# Patient Record
Sex: Female | Born: 1960 | Race: White | Hispanic: Refuse to answer | Smoking: Never smoker
Health system: Southern US, Community
[De-identification: ages and names within clinical notes are randomized; demographics above are authoritative.]

## PROBLEM LIST (undated history)

## (undated) DIAGNOSIS — I447 Left bundle-branch block, unspecified: Secondary | ICD-10-CM

## (undated) DIAGNOSIS — I454 Nonspecific intraventricular block: Secondary | ICD-10-CM

## (undated) DIAGNOSIS — F419 Anxiety disorder, unspecified: Secondary | ICD-10-CM

## (undated) DIAGNOSIS — E7800 Pure hypercholesterolemia, unspecified: Secondary | ICD-10-CM

## (undated) DIAGNOSIS — Z8249 Family history of ischemic heart disease and other diseases of the circulatory system: Secondary | ICD-10-CM

## (undated) HISTORY — DX: Family history of ischemic heart disease and other diseases of the circulatory system: Z82.49

## (undated) HISTORY — DX: Nonspecific intraventricular block: I45.4

## (undated) HISTORY — DX: Left bundle-branch block, unspecified: I44.7

## (undated) HISTORY — PX: WISDOM TOOTH EXTRACTION: SHX21

---

## 2000-09-29 ENCOUNTER — Other Ambulatory Visit: Admission: RE | Admit: 2000-09-29 | Discharge: 2000-09-29 | Payer: Self-pay | Admitting: *Deleted

## 2000-12-27 ENCOUNTER — Encounter: Payer: Self-pay | Admitting: *Deleted

## 2000-12-27 ENCOUNTER — Encounter: Admission: RE | Admit: 2000-12-27 | Discharge: 2000-12-27 | Payer: Self-pay | Admitting: *Deleted

## 2001-11-06 ENCOUNTER — Other Ambulatory Visit: Admission: RE | Admit: 2001-11-06 | Discharge: 2001-11-06 | Payer: Self-pay | Admitting: Obstetrics & Gynecology

## 2002-05-02 ENCOUNTER — Encounter: Admission: RE | Admit: 2002-05-02 | Discharge: 2002-05-02 | Payer: Self-pay | Admitting: Obstetrics & Gynecology

## 2002-05-02 ENCOUNTER — Encounter: Payer: Self-pay | Admitting: Obstetrics & Gynecology

## 2002-11-15 ENCOUNTER — Other Ambulatory Visit: Admission: RE | Admit: 2002-11-15 | Discharge: 2002-11-15 | Payer: Self-pay | Admitting: Obstetrics & Gynecology

## 2005-11-01 ENCOUNTER — Ambulatory Visit (HOSPITAL_COMMUNITY): Admission: RE | Admit: 2005-11-01 | Discharge: 2005-11-01 | Payer: Self-pay | Admitting: Obstetrics & Gynecology

## 2006-11-14 ENCOUNTER — Ambulatory Visit (HOSPITAL_COMMUNITY): Admission: RE | Admit: 2006-11-14 | Discharge: 2006-11-14 | Payer: Self-pay | Admitting: Obstetrics & Gynecology

## 2006-11-16 ENCOUNTER — Encounter: Admission: RE | Admit: 2006-11-16 | Discharge: 2006-11-16 | Payer: Self-pay | Admitting: Obstetrics & Gynecology

## 2007-12-06 ENCOUNTER — Ambulatory Visit (HOSPITAL_COMMUNITY): Admission: RE | Admit: 2007-12-06 | Discharge: 2007-12-06 | Payer: Self-pay | Admitting: Obstetrics & Gynecology

## 2008-12-31 ENCOUNTER — Ambulatory Visit: Payer: Self-pay | Admitting: Internal Medicine

## 2009-01-22 HISTORY — PX: NM MYOCAR PERF WALL MOTION: HXRAD629

## 2009-01-22 HISTORY — PX: US ECHOCARDIOGRAPHY: HXRAD669

## 2009-02-13 ENCOUNTER — Encounter: Payer: Self-pay | Admitting: Internal Medicine

## 2009-02-13 ENCOUNTER — Telehealth: Payer: Self-pay | Admitting: Internal Medicine

## 2009-03-03 ENCOUNTER — Ambulatory Visit (HOSPITAL_COMMUNITY): Admission: RE | Admit: 2009-03-03 | Discharge: 2009-03-03 | Payer: Self-pay | Admitting: Obstetrics & Gynecology

## 2009-03-16 ENCOUNTER — Encounter (INDEPENDENT_AMBULATORY_CARE_PROVIDER_SITE_OTHER): Payer: Self-pay | Admitting: *Deleted

## 2009-03-17 ENCOUNTER — Ambulatory Visit: Payer: Self-pay | Admitting: Internal Medicine

## 2009-03-25 ENCOUNTER — Telehealth: Payer: Self-pay | Admitting: Internal Medicine

## 2009-03-26 ENCOUNTER — Ambulatory Visit: Payer: Self-pay | Admitting: Internal Medicine

## 2009-10-27 ENCOUNTER — Encounter (INDEPENDENT_AMBULATORY_CARE_PROVIDER_SITE_OTHER): Payer: Self-pay | Admitting: *Deleted

## 2009-10-27 ENCOUNTER — Telehealth: Payer: Self-pay | Admitting: Internal Medicine

## 2010-02-25 ENCOUNTER — Ambulatory Visit: Payer: Self-pay | Admitting: Licensed Clinical Social Worker

## 2010-03-03 ENCOUNTER — Ambulatory Visit: Payer: Self-pay | Admitting: Licensed Clinical Social Worker

## 2010-03-04 ENCOUNTER — Ambulatory Visit (HOSPITAL_COMMUNITY): Admission: RE | Admit: 2010-03-04 | Discharge: 2010-03-04 | Payer: Self-pay | Admitting: Obstetrics & Gynecology

## 2010-03-12 ENCOUNTER — Ambulatory Visit: Payer: Self-pay | Admitting: Licensed Clinical Social Worker

## 2010-03-16 ENCOUNTER — Ambulatory Visit: Payer: Self-pay | Admitting: Licensed Clinical Social Worker

## 2010-03-26 ENCOUNTER — Ambulatory Visit: Payer: Self-pay | Admitting: Licensed Clinical Social Worker

## 2010-03-31 ENCOUNTER — Ambulatory Visit: Payer: Self-pay | Admitting: Licensed Clinical Social Worker

## 2010-04-09 ENCOUNTER — Ambulatory Visit: Payer: Self-pay | Admitting: Licensed Clinical Social Worker

## 2010-05-12 ENCOUNTER — Ambulatory Visit
Admission: RE | Admit: 2010-05-12 | Discharge: 2010-05-12 | Payer: Self-pay | Source: Home / Self Care | Attending: Licensed Clinical Social Worker | Admitting: Licensed Clinical Social Worker

## 2010-05-25 NOTE — Progress Notes (Signed)
Summary: triage  Phone Note Call from Patient Call back at Work Phone [phone]   Caller: Patient Reason for Call: Talk to Nurse Summary of Call: Pt is having reflux and abdominal pain. Pt states it is getting worse.  Appt scheduled for 9/15/11with Dr. Juanda Chance, but pt would like a sooner appt. Initial call taken by: Francee Piccolo CMA Duncan Dull),  October 27, 2009 12:27 PM  Follow-up for Phone Call        Patient  c/o refulx and upper abdominal pain.  Started this weekend.  She tried Prilosec OTC and Pepcid and this helped her symptoms.  Patient  has hx with Dr Juanda Chance for a colon.  She is offered to move her appointment to this Friday for 10/30/09, but the patient declines, she wants to keep the appt as is and try dietary changes first.  I reviewed with her anti-reflux diet and measuers including avoid caffeine, mint, citrus foods/juices, tomatoes, chocolate. Instructed not to eat within 2 hours of exercise or bed, small meals are better than 3 large.   She is advised to make sure she is taking Prilosec 30 minutes before the first meal of the day and can increase to two times a day if needed.  She is asked to call back if symptoms worsen prior to her appointment. Follow-up by: Darcey Nora RN, CGRN,  October 27, 2009 1:35 PM  Additional Follow-up for Phone Call Additional follow up Details #1::        reviewed and agree. Try Prilosec as instructed. Additional Follow-up by: Hart Carwin MD,  October 27, 2009 2:46 PM

## 2010-05-25 NOTE — Letter (Signed)
Summary: New Patient letter  [facility]  [address] [place], [place] [address]   Phone: [phone]  Fax: [phone]       10/27/2009 MRN: [id number]  Barbara Nixon [address] [place], [place]  [address]  Dear Barbara Nixon,  Welcome to the Gastroenterology Division at [facility].    You are scheduled to see Dr.  Juanda Chance on January 07, 2010 at 1:30 pm on the 3rd floor at [facility], [address]  We ask that you try to arrive at our office 15 minutes prior to your appointment time to allow for check-in.  We would like you to complete the enclosed self-administered evaluation form prior to your visit and bring it with you on the day of your appointment.  We will review it with you.  Also, please bring a complete list of all your medications or, if you prefer, bring the medication bottles and we will list them.  Please bring your insurance card so that we may make a copy of it.  If your insurance requires a referral to see a specialist, please bring your referral form from your primary care physician.  Co-payments are due at the time of your visit and may be paid by cash, check or credit card.     Your office visit will consist of a consult with your physician (includes a physical exam), any laboratory testing he/she may order, scheduling of any necessary diagnostic testing (e.g. x-ray, ultrasound, CT-scan), and scheduling of a procedure (e.g. Endoscopy, Colonoscopy) if required.  Please allow enough time on your schedule to allow for any/all of these possibilities.    If you cannot keep your appointment, please call [phone] to cancel or reschedule prior to your appointment date.  This allows [place] the opportunity to schedule an appointment for another patient in need of care.  If you do not cancel or reschedule by 5 p.m. the business day prior to your appointment date, you will be charged a $50.00 late cancellation/no-show fee.    Thank you for  choosing [facility] for your medical needs.  We appreciate the opportunity to care for you.  Please visit [place] at our website  to learn more about our practice.                     Sincerely,                                                             The Gastroenterology Division

## 2010-05-28 ENCOUNTER — Ambulatory Visit (INDEPENDENT_AMBULATORY_CARE_PROVIDER_SITE_OTHER): Payer: Managed Care, Other (non HMO) | Admitting: Licensed Clinical Social Worker

## 2010-05-28 DIAGNOSIS — F4323 Adjustment disorder with mixed anxiety and depressed mood: Secondary | ICD-10-CM

## 2010-07-22 ENCOUNTER — Ambulatory Visit (INDEPENDENT_AMBULATORY_CARE_PROVIDER_SITE_OTHER): Payer: Managed Care, Other (non HMO) | Admitting: Licensed Clinical Social Worker

## 2010-07-22 DIAGNOSIS — F411 Generalized anxiety disorder: Secondary | ICD-10-CM

## 2010-07-22 DIAGNOSIS — F331 Major depressive disorder, recurrent, moderate: Secondary | ICD-10-CM

## 2010-08-12 ENCOUNTER — Ambulatory Visit (INDEPENDENT_AMBULATORY_CARE_PROVIDER_SITE_OTHER): Payer: Managed Care, Other (non HMO) | Admitting: Licensed Clinical Social Worker

## 2010-08-12 DIAGNOSIS — F331 Major depressive disorder, recurrent, moderate: Secondary | ICD-10-CM

## 2010-08-12 DIAGNOSIS — F411 Generalized anxiety disorder: Secondary | ICD-10-CM

## 2010-08-19 ENCOUNTER — Ambulatory Visit (INDEPENDENT_AMBULATORY_CARE_PROVIDER_SITE_OTHER): Payer: Managed Care, Other (non HMO) | Admitting: Licensed Clinical Social Worker

## 2010-08-19 DIAGNOSIS — F411 Generalized anxiety disorder: Secondary | ICD-10-CM

## 2010-08-19 DIAGNOSIS — F331 Major depressive disorder, recurrent, moderate: Secondary | ICD-10-CM

## 2011-10-18 ENCOUNTER — Other Ambulatory Visit (HOSPITAL_COMMUNITY): Payer: Self-pay | Admitting: Obstetrics & Gynecology

## 2011-10-18 DIAGNOSIS — Z1231 Encounter for screening mammogram for malignant neoplasm of breast: Secondary | ICD-10-CM

## 2011-11-11 ENCOUNTER — Ambulatory Visit (HOSPITAL_COMMUNITY)
Admission: RE | Admit: 2011-11-11 | Discharge: 2011-11-11 | Disposition: A | Discharge status: Home / Self Care | Payer: Managed Care, Other (non HMO) | Source: Ambulatory Visit | Attending: Obstetrics & Gynecology | Admitting: Obstetrics & Gynecology

## 2011-11-11 DIAGNOSIS — Z1231 Encounter for screening mammogram for malignant neoplasm of breast: Secondary | ICD-10-CM

## 2012-05-16 ENCOUNTER — Other Ambulatory Visit: Payer: Self-pay | Admitting: Orthopedic Surgery

## 2012-05-16 ENCOUNTER — Encounter (HOSPITAL_BASED_OUTPATIENT_CLINIC_OR_DEPARTMENT_OTHER): Payer: Self-pay | Admitting: *Deleted

## 2012-05-16 NOTE — Progress Notes (Signed)
To [facility] at 0715- Hg on arrival,Npo after Mn-orders pending from Dr Shelle Iron

## 2012-05-17 ENCOUNTER — Other Ambulatory Visit: Payer: Self-pay | Admitting: Orthopedic Surgery

## 2012-05-18 ENCOUNTER — Encounter (HOSPITAL_BASED_OUTPATIENT_CLINIC_OR_DEPARTMENT_OTHER): Payer: Self-pay | Admitting: Anesthesiology

## 2012-05-18 ENCOUNTER — Ambulatory Visit (HOSPITAL_BASED_OUTPATIENT_CLINIC_OR_DEPARTMENT_OTHER)
Admission: RE | Admit: 2012-05-18 | Discharge: 2012-05-18 | Disposition: A | Discharge status: Home / Self Care | Payer: Managed Care, Other (non HMO) | Source: Ambulatory Visit | Attending: Specialist | Admitting: Specialist

## 2012-05-18 ENCOUNTER — Encounter (HOSPITAL_BASED_OUTPATIENT_CLINIC_OR_DEPARTMENT_OTHER): Payer: Self-pay

## 2012-05-18 ENCOUNTER — Ambulatory Visit (HOSPITAL_BASED_OUTPATIENT_CLINIC_OR_DEPARTMENT_OTHER): Payer: Managed Care, Other (non HMO) | Admitting: Anesthesiology

## 2012-05-18 ENCOUNTER — Encounter (HOSPITAL_BASED_OUTPATIENT_CLINIC_OR_DEPARTMENT_OTHER)
Admission: RE | Disposition: A | Discharge status: Home / Self Care | Payer: Self-pay | Source: Ambulatory Visit | Attending: Specialist

## 2012-05-18 DIAGNOSIS — X58XXXA Exposure to other specified factors, initial encounter: Secondary | ICD-10-CM | POA: Insufficient documentation

## 2012-05-18 DIAGNOSIS — Z79899 Other long term (current) drug therapy: Secondary | ICD-10-CM | POA: Insufficient documentation

## 2012-05-18 DIAGNOSIS — E7800 Pure hypercholesterolemia, unspecified: Secondary | ICD-10-CM | POA: Insufficient documentation

## 2012-05-18 DIAGNOSIS — IMO0002 Reserved for concepts with insufficient information to code with codable children: Secondary | ICD-10-CM | POA: Insufficient documentation

## 2012-05-18 DIAGNOSIS — M2240 Chondromalacia patellae, unspecified knee: Secondary | ICD-10-CM | POA: Insufficient documentation

## 2012-05-18 DIAGNOSIS — S83242A Other tear of medial meniscus, current injury, left knee, initial encounter: Secondary | ICD-10-CM

## 2012-05-18 HISTORY — DX: Anxiety disorder, unspecified: F41.9

## 2012-05-18 HISTORY — PX: KNEE ARTHROSCOPY WITH MEDIAL MENISECTOMY: SHX5651

## 2012-05-18 HISTORY — DX: Pure hypercholesterolemia, unspecified: E78.00

## 2012-05-18 LAB — POCT I-STAT 4, (NA,K, GLUC, HGB,HCT)
HCT: 44 % (ref 36.0–46.0)
Sodium: 140 mEq/L (ref 135–145)

## 2012-05-18 SURGERY — ARTHROSCOPY, KNEE, WITH MEDIAL MENISCECTOMY
Anesthesia: General | Site: Knee | Laterality: Left

## 2012-05-18 MED ORDER — ONDANSETRON HCL 4 MG/2ML IJ SOLN
INTRAMUSCULAR | Status: DC | PRN
  Administered 2012-05-18: 4 mg via INTRAVENOUS

## 2012-05-18 MED ORDER — SODIUM CHLORIDE 0.9 % IR SOLN
Status: DC | PRN
  Administered 2012-05-18: 09:00:00

## 2012-05-18 MED ORDER — FENTANYL CITRATE 0.05 MG/ML IJ SOLN
25.0000 ug | INTRAMUSCULAR | Status: DC | PRN
  Filled 2012-05-18: qty 1

## 2012-05-18 MED ORDER — DEXAMETHASONE SODIUM PHOSPHATE 4 MG/ML IJ SOLN
INTRAMUSCULAR | Status: DC | PRN
  Administered 2012-05-18: 10 mg via INTRAVENOUS

## 2012-05-18 MED ORDER — BUPIVACAINE-EPINEPHRINE 0.5% -1:200000 IJ SOLN
INTRAMUSCULAR | Status: DC | PRN
  Administered 2012-05-18: 25 mL

## 2012-05-18 MED ORDER — LACTATED RINGERS IV SOLN
INTRAVENOUS | Status: DC
  Administered 2012-05-18: 08:00:00 via INTRAVENOUS
  Filled 2012-05-18: qty 1000

## 2012-05-18 MED ORDER — KETOROLAC TROMETHAMINE 30 MG/ML IJ SOLN
INTRAMUSCULAR | Status: DC | PRN
  Administered 2012-05-18: 30 mg via INTRAVENOUS

## 2012-05-18 MED ORDER — LIDOCAINE HCL (CARDIAC) 20 MG/ML IV SOLN
INTRAVENOUS | Status: DC | PRN
  Administered 2012-05-18: 60 mg via INTRAVENOUS

## 2012-05-18 MED ORDER — PROPOFOL 10 MG/ML IV BOLUS
INTRAVENOUS | Status: DC | PRN
  Administered 2012-05-18: 200 mg via INTRAVENOUS

## 2012-05-18 MED ORDER — MIDAZOLAM HCL 5 MG/5ML IJ SOLN
INTRAMUSCULAR | Status: DC | PRN
  Administered 2012-05-18: 2 mg via INTRAVENOUS

## 2012-05-18 MED ORDER — KETOROLAC TROMETHAMINE 30 MG/ML IJ SOLN
15.0000 mg | Freq: Once | INTRAMUSCULAR | Status: DC | PRN
  Filled 2012-05-18: qty 1

## 2012-05-18 MED ORDER — PROMETHAZINE HCL 25 MG/ML IJ SOLN
6.2500 mg | INTRAMUSCULAR | Status: DC | PRN
  Filled 2012-05-18: qty 1

## 2012-05-18 MED ORDER — LACTATED RINGERS IV SOLN
INTRAVENOUS | Status: DC
  Filled 2012-05-18: qty 1000

## 2012-05-18 MED ORDER — CHLORHEXIDINE GLUCONATE 4 % EX LIQD
60.0000 mL | Freq: Once | CUTANEOUS | Status: DC
  Filled 2012-05-18: qty 60

## 2012-05-18 MED ORDER — ACETAMINOPHEN 10 MG/ML IV SOLN
INTRAVENOUS | Status: DC | PRN
  Administered 2012-05-18: 1000 mg via INTRAVENOUS

## 2012-05-18 MED ORDER — HYDROCODONE-ACETAMINOPHEN 5-325 MG PO TABS: 1.0000 | ORAL_TABLET | ORAL | Status: DC | PRN

## 2012-05-18 MED ORDER — FENTANYL CITRATE 0.05 MG/ML IJ SOLN
INTRAMUSCULAR | Status: DC | PRN
  Administered 2012-05-18: 100 ug via INTRAVENOUS

## 2012-05-18 MED ORDER — CEFAZOLIN SODIUM-DEXTROSE 2-3 GM-% IV SOLR
2.0000 g | INTRAVENOUS | Status: AC
  Administered 2012-05-18: 2 g via INTRAVENOUS
  Filled 2012-05-18: qty 50

## 2012-05-18 MED ORDER — SODIUM CHLORIDE 0.9 % IR SOLN
Status: DC | PRN
  Administered 2012-05-18: 3000 mL

## 2012-05-18 SURGICAL SUPPLY — 46 items
BANDAGE ELASTIC 6 VELCRO ST LF (GAUZE/BANDAGES/DRESSINGS) ×2 IMPLANT
BLADE 4.2CUDA (BLADE) IMPLANT
BLADE CUDA SHAVER 3.5 (BLADE) ×2 IMPLANT
BLADE GREAT WHITE 4.2 (BLADE) IMPLANT
BOOTIES KNEE HIGH SLOAN (MISCELLANEOUS) ×2 IMPLANT
CANISTER SUCT LVC 12 LTR MEDI- (MISCELLANEOUS) ×2 IMPLANT
CANISTER SUCTION 1200CC (MISCELLANEOUS) IMPLANT
CANISTER SUCTION 2500CC (MISCELLANEOUS) IMPLANT
CANNULA ACUFLEX KIT 5X76 (CANNULA) IMPLANT
CLOTH BEACON ORANGE TIMEOUT ST (SAFETY) ×2 IMPLANT
CUTTER MENISCUS  4.2MM (BLADE)
CUTTER MENISCUS 4.2MM (BLADE) IMPLANT
DRAPE ARTHROSCOPY W/POUCH 114 (DRAPES) ×2 IMPLANT
DRSG EMULSION OIL 3X3 NADH (GAUZE/BANDAGES/DRESSINGS) ×2 IMPLANT
DRSG PAD ABDOMINAL 8X10 ST (GAUZE/BANDAGES/DRESSINGS) ×2 IMPLANT
DURAPREP 26ML APPLICATOR (WOUND CARE) ×2 IMPLANT
ELECT REM PT RETURN 9FT ADLT (ELECTROSURGICAL)
ELECTRODE REM PT RTRN 9FT ADLT (ELECTROSURGICAL) IMPLANT
GAUZE SPONGE 4X4 12PLY STRL LF (GAUZE/BANDAGES/DRESSINGS) ×2 IMPLANT
GLOVE BIO SURGEON STRL SZ 6.5 (GLOVE) ×2 IMPLANT
GLOVE BIO SURGEON STRL SZ7 (GLOVE) ×2 IMPLANT
GLOVE BIOGEL PI IND STRL 7.0 (GLOVE) ×2 IMPLANT
GLOVE BIOGEL PI INDICATOR 7.0 (GLOVE) ×2
GLOVE SURG SS PI 8.0 STRL IVOR (GLOVE) ×2 IMPLANT
GOWN PREVENTION PLUS LG XLONG (DISPOSABLE) ×2 IMPLANT
GOWN STRL REIN XL XLG (GOWN DISPOSABLE) ×6 IMPLANT
IV NS IRRIG 3000ML ARTHROMATIC (IV SOLUTION) ×6 IMPLANT
KNEE WRAP E Z 3 GEL PACK (MISCELLANEOUS) ×2 IMPLANT
MINI VAC (SURGICAL WAND) IMPLANT
NDL SAFETY ECLIPSE 18X1.5 (NEEDLE) ×2 IMPLANT
NEEDLE FILTER BLUNT 18X 1/2SAF (NEEDLE) ×1
NEEDLE FILTER BLUNT 18X1 1/2 (NEEDLE) ×1 IMPLANT
NEEDLE HYPO 18GX1.5 SHARP (NEEDLE) ×2
PACK ARTHROSCOPY DSU (CUSTOM PROCEDURE TRAY) ×2 IMPLANT
PACK BASIN DAY SURGERY FS (CUSTOM PROCEDURE TRAY) ×2 IMPLANT
PADDING CAST COTTON 6X4 STRL (CAST SUPPLIES) ×2 IMPLANT
RESECTOR FULL RADIUS 4.2MM (BLADE) IMPLANT
SET ARTHROSCOPY TUBING (MISCELLANEOUS) ×1
SET ARTHROSCOPY TUBING LN (MISCELLANEOUS) ×1 IMPLANT
SPONGE GAUZE 4X4 12PLY (GAUZE/BANDAGES/DRESSINGS) ×2 IMPLANT
SUT ETHILON 4 0 PS 2 18 (SUTURE) ×2 IMPLANT
SYR 30ML LL (SYRINGE) ×2 IMPLANT
SYRINGE 10CC LL (SYRINGE) ×2 IMPLANT
TOWEL OR 17X24 6PK STRL BLUE (TOWEL DISPOSABLE) ×2 IMPLANT
WAND 90 DEG TURBOVAC W/CORD (SURGICAL WAND) IMPLANT
WATER STERILE IRR 500ML POUR (IV SOLUTION) ×2 IMPLANT

## 2012-05-18 NOTE — Brief Op Note (Signed)
05/18/2012  9:54 AM  PATIENT:  Barbara Nixon  52 y.o. female  PRE-OPERATIVE DIAGNOSIS:  Left Knee medial mensical tear  POST-OPERATIVE DIAGNOSIS:  Left Knee medial mensical tear  PROCEDURE:  Procedure(s) (LRB) with comments: KNEE ARTHROSCOPY WITH MEDIAL MENISECTOMY (Left) - Left Knee Arthroscopy with Partial Medial Menisectomy AND DEBRIDEMENT  SURGEON:  Surgeon(s) and Role:    * [name], MD - Primary  PHYSICIAN ASSISTANT:   ASSISTANTS: none   ANESTHESIA:   general  EBL:  Total I/O In: 200 [I.V.:200] Out: -   BLOOD ADMINISTERED:none  DRAINS: none   LOCAL MEDICATIONS USED:  MARCAINE     SPECIMEN:  No Specimen  DISPOSITION OF SPECIMEN:  N/A  COUNTS:  YES  TOURNIQUET:  * No tourniquets in log *  DICTATION: .Other Dictation: Dictation Number [id number]  PLAN OF CARE: Discharge to home after PACU  PATIENT DISPOSITION:  PACU - hemodynamically stable.   Delay start of Pharmacological VTE agent (>24hrs) due to surgical blood loss or risk of bleeding: no

## 2012-05-18 NOTE — Anesthesia Postprocedure Evaluation (Signed)
  Anesthesia Post-op Note  Patient: Barbara Nixon  Procedure(s) Performed: Procedure(s) (LRB): KNEE ARTHROSCOPY WITH MEDIAL MENISECTOMY (Left)  Patient Location: PACU  Anesthesia Type: General  Level of Consciousness: awake and alert   Airway and Oxygen Therapy: Patient Spontanous Breathing  Post-op Pain: mild  Post-op Assessment: Post-op Vital signs reviewed, Patient's Cardiovascular Status Stable, Respiratory Function Stable, Patent Airway and No signs of Nausea or vomiting  Last Vitals:  Filed Vitals:   05/18/12 0810  BP: 135/73  Pulse: 65  Temp: 36.7 C  Resp: 18    Post-op Vital Signs: stable   Complications: No apparent anesthesia complications

## 2012-05-18 NOTE — Transfer of Care (Signed)
Immediate Anesthesia Transfer of Care Note  Patient: Barbara Nixon  Procedure(s) Performed: Procedure(s) (LRB) with comments: KNEE ARTHROSCOPY WITH MEDIAL MENISECTOMY (Left) - Left Knee Arthroscopy with Partial Medial Menisectomy AND DEBRIDEMENT  Patient Location: PACU  Anesthesia Type:General  Level of Consciousness: awake, alert  and oriented  Airway & Oxygen Therapy: Patient Spontanous Breathing and Patient connected to nasal cannula oxygen  Post-op Assessment: Report given to PACU RN  Post vital signs: Reviewed and stable  Complications: No apparent anesthesia complications

## 2012-05-18 NOTE — Anesthesia Procedure Notes (Signed)
Procedure Name: LMA Insertion Date/Time: 05/18/2012 9:07 AM Performed by: Maris Berger T Pre-anesthesia Checklist: Patient identified, Emergency Drugs available, Suction available and Patient being monitored Patient Re-evaluated:Patient Re-evaluated prior to inductionOxygen Delivery Method: Circle System Utilized Preoxygenation: Pre-oxygenation with 100% oxygen Intubation Type: IV induction Ventilation: Mask ventilation without difficulty LMA: LMA inserted LMA Size: 4.0 Number of attempts: 1 Placement Confirmation: positive ETCO2 Dental Injury: Teeth and Oropharynx as per pre-operative assessment  Comments: Gauze roll between teeth

## 2012-05-18 NOTE — Discharge Instructions (Signed)
ARTHROSCOPIC KNEE SURGERY HOME CARE INSTRUCTIONS   PAIN You will be expected to have a moderate amount of pain in the affected knee for approximately two weeks.  However, the first two to four days will be the most severe in terms of the pain you will experience.  Prescriptions have been provided for you to take as needed for the pain.  The pain can be markedly reduced by using the ice/compressive bandage given.  Exchange the ice packs whenever they thaw.  During the night, keep the bandage on because it will still provide some compression for the swelling.  Also, keep the leg elevated on pillows above your heart, and this will help alleviate the pain and swelling.  ACTIVITY It is preferred that you stay on bedrest for approximately 24 hours.  However, you may go to the bathroom with help.  After this, you can start to be up and about progressively more.  Remember that the swelling may still increase after three to four days if you are up and doing too much.  You may put as much weight on the affected leg as pain will allow.  Use your crutches for comfort and safety.  However, as soon as you are able, you may discard the crutches and go without them.   DRESSING Keep the current dressing as dry as possible.  Two days after your surgery, you may remove the ice/compressive wrap, and surgical dressing.  You may now take a shower, but do not scrub the sounds directly with soap.  Let water rinse over these and gently wipe with your hand.  Reapply band-aids over the puncture wounds and more gauze if needed.  A slight amount of thin drainage can be normal at this time, and do not let it frighten you.  Reapply the ice/compressive wrap.  You may now repeat this every day each time you shower.  SYMPTOMS TO REPORT TO YOUR DOCTOR  -Extreme pain.  -Extreme swelling.  -Temperature above 101 degrees that does not come down with acetaminophen     (Tylenol).  -Any changes in the feeling, color or movement of your  toes.  -Extreme redness, heat, swelling or drainage at your incision  EXERCISE It is preferred that you begin to exercise on the day of your surgery.  Straight leg raises and short arc quads should be begun the afternoon or evening of surgery and continued until you come back for your follow-up appointment.   Attached is an instruction sheet on how to perform these two simple exercises.  Do these at least three times per day if not more.  You may bend your knee as much as is comfortable.  The puncture wounds may occasionally be slightly uncomfortable with bending of the knee.  Do not let this frighten you.  It is important to keep your knee motion, but do not overdo it.  If you have significant pain, simply do not bend the knee as far.   You will be given more exercises to perform at your first return visit.   Take one 325mg  aspirin per day with a meal. Change dressing in two days. Meniscus Injury of the Knee, Arthroscopy You may have an internal derangement of the knee. This means something is wrong inside the knee. Your caregiver can make a more accurate diagnosis (learning what is wrong) by performing an arthroscopic procedure. Your knee has two layers of cartilage. Articular cartilage covers the bone ends. It lets your knee bend and move smoothly. Two menisci (  thick pads of cartilage that form a rim inside the joint) help absorb shock. They stabilize your knee. Ligaments bind the bones together. They support your knee joint. Muscles move the joint, help support your knee, and take stress off the joint itself.  ABOUT THE PROCEDURE Arthroscopy is a surgical technique. It allows your orthopedic surgeon to diagnose and treat your knee injury with accuracy. The surgeon looks into your knee through a small scope. The scope is like a small (pencil-sized) telescope. Arthroscopy is less invasive than open knee surgery. You can expect a more rapid recovery. Following your caregiver's instructions will help you  recover rapidly and completely. Use crutches, rest, elevate, ice, and do knee exercises as instructed. The length of recovery depends on various factors. These factors include type of injury, age, physical condition, medical conditions, and your determination. How long you will be away from your normal activities will depend on what kind of knee problem you have. It will also depend on how much tissue is damaged. Rebuilding your muscles after arthroscopy helps ensure a full recovery. RECOVERY Recovery after a meniscus injury depends on how much meniscus is damaged. It also depends on whether or not you have damaged other knee tissue. With small tears, your recovery may take a couple weeks. Larger tears will take longer. Meniscus injuries can usually be treated during arthroscopy. If your injury is on the inner edge of the meniscus, your surgeon may trim the meniscus back to a smooth rim. In other cases, your surgeon will try to repair a damaged meniscus with sutures (stitches). This may lengthen your rehabilitation. It may provide better long-term health by helping your knee retain its shock absorption abilities. Use crutches, limit weight bearing, rest, elevate, apply ice, and exercise your knee as instructed. If a brace is applied, use as directed. The length of recovery depends on various factors including type of injury, age, physical condition, other medical conditions, and your determination. Your caregiver will help with instructions for rehabilitation of your knee. HOME CARE INSTRUCTIONS  Use crutches and knee exercises as instructed.  Applying an ice pack to your operative site may help with discomfort. It may also keep the swelling down.  Only take over-the-counter or prescription medicines for pain, discomfort, inflammation (soreness)or fever as directed by your caregiver. You may use these only if your caregiver has not given medications that would interfere.  You may resume normal diet and  activities as directed. SEEK MEDICAL ATTENTION IF:  There is increased bleeding (more than a small spot) from the wound.  You notice redness, swelling, or increasing pain in the wound.  Pus is coming from wound.  An unexplained oral temperature above 102 F (38.9 C) develops, or as your caregiver suggests.  You notice a foul smell coming from the wound or dressing. SEEK IMMEDIATE MEDICAL CARE IF:  You develop a rash.  You have difficulty breathing.  You have any allergic problems. Document Released: 04/08/2000 Document Revised: 07/04/2011 Document Reviewed: 06/25/2007 [facility] Patient Information 2013 [place], [place].  RETURN APPOINTMENT Please make an appointment to be seen by your doctor in 14 days from your surgery.  Patient Signature:  ________________________________________________________  Nurse's Signature:  ________________________________________________________ Post Anesthesia Home Care Instructions  Activity: Get plenty of rest for the remainder of the day. A responsible adult should stay with you for 24 hours following the procedure.  For the next 24 hours, DO NOT: -Drive a car -Advertising copywriter -Drink alcoholic beverages -Take any medication unless instructed by your  physician -Make any legal decisions or sign important papers.  Meals: Start with liquid foods such as gelatin or soup. Progress to regular foods as tolerated. Avoid greasy, spicy, heavy foods. If nausea and/or vomiting occur, drink only clear liquids until the nausea and/or vomiting subsides. Call your physician if vomiting continues.  Special Instructions/Symptoms: Your throat may feel dry or sore from the anesthesia or the breathing tube placed in your throat during surgery. If this causes discomfort, gargle with warm salt water. The discomfort should disappear within 24 hours.

## 2012-05-18 NOTE — H&P (Signed)
Barbara Nixon is an 52 y.o. female.   Chief Complaint: Left knee pain HPI: refractory pain left knee meniscus tear.  Past Medical History  Diagnosis Date  . Anxiety   . Hypercholesteremia     takes statin    Past Surgical History  Procedure Date  . Wisdom tooth extraction unknown    History reviewed. No pertinent family history. Social History:  reports that she has never smoked. She does not have any smokeless tobacco history on file. She reports that she drinks alcohol. Her drug history not on file.  Allergies:  Allergies  Allergen Reactions  . Penicillins     REACTION: as child, unspecified    Medications Prior to Admission  Medication Sig Dispense Refill  . acetaminophen (TYLENOL) 500 MG tablet Take 1,000 mg by mouth every 6 (six) hours as needed.      Marland Kitchen FLUoxetine (PROZAC) 20 MG capsule Take 20 mg by mouth daily.      . norethindrone-ethinyl estradiol (MICROGESTIN,JUNEL,LOESTRIN) 1-20 MG-MCG tablet Take 1 tablet by mouth daily.      . simvastatin (ZOCOR) 20 MG tablet Take 20 mg by mouth every evening.      Marland Kitchen ibuprofen (ADVIL,MOTRIN) 200 MG tablet Take 200 mg by mouth every 6 (six) hours as needed.        No results found for this or any previous visit (from the past 48 hour(s)). No results found.  Review of Systems  Musculoskeletal: Positive for joint pain.  All other systems reviewed and are negative.    Height 5\' 1"  (1.549 m), weight 68.04 kg (150 lb). Physical Exam  Constitutional: She is oriented to person, place, and time. She appears well-developed.  HENT:  Head: Normocephalic.  Eyes: Pupils are equal, round, and reactive to light.  Neck: Normal range of motion.  Cardiovascular: Normal rate.   Respiratory: Effort normal.  GI: Soft.  Musculoskeletal:       +effusion left knee. +Mcmurray left. Tender MJLT. No DVT. NVI  Neurological: She is alert and oriented to person, place, and time.  Skin: Skin is warm and dry.  Psychiatric: She has a normal mood  and affect.   MRI medial meniscus tear. DJD. 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 Plan arthroscopy. Risks discussed.  Elon Lomeli C 05/18/2012, 7:40 AM

## 2012-05-18 NOTE — Anesthesia Preprocedure Evaluation (Signed)

## 2012-05-21 ENCOUNTER — Encounter (HOSPITAL_BASED_OUTPATIENT_CLINIC_OR_DEPARTMENT_OTHER): Payer: Self-pay | Admitting: Specialist

## 2012-05-21 NOTE — Op Note (Signed)
[name], Barbara Nixon                 ACCOUNT NO.:  [id number]  MEDICAL RECORD NO.:  [id number]  LOCATION:                                 FACILITY:  PHYSICIAN:  Jene Every, M.D.         DATE OF BIRTH:  DATE OF PROCEDURE:  05/18/2012 DATE OF DISCHARGE:                              OPERATIVE REPORT   PREOPERATIVE DIAGNOSIS:  Medial meniscus tear of the left knee.  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  PROCEDURES PERFORMED: 1. Left knee arthroscopy. 2. Partial medial meniscectomy. 3. Chondroplasty of the patella.  HISTORY:  51, locking, popping, giving way.  Knee pain.  MRI of the posterior meniscus tear mechanical symptoms, pain, stress reaction of the tibia.  Indicated for arthroscopic partial meniscectomy, mechanical symptoms, debridement, evaluation of the region over the stress reaction in the proximal tibial plateau, was felt that perhaps high impact activities was generating this versus bone density issue.  We discussed risks and benefits of the procedure including bleeding, infection, damage to vascular structures, no change in symptoms, worsening symptoms, need for activity curtailment, DVT, PE, anesthetic complications, etc.  TECHNIQUE:  With the patient in supine position, after induction of adequate general anesthesia, 2 g Kefzol, left lower extremity was prepped and draped in usual sterile fashion.  Lateral parapatellar portal was fashioned with a #11 blade.  Ingress cannula atraumatically placed.  Irrigant was utilized to insufflate the joint.  Under direct visualization, a medial parapatellar portal was fashioned with a #11 blade after localization with 18-gauge needle sparing the medial meniscus.  Noted was a posterior root medial meniscus tear, was unstable to probe palpation.  There was however 40% of posterior root that was still  intact.  I introduced a straight small upbiting basket and resected the third of the posterior third to avoid posterior impingement.  The remnant was stable to probe palpation.  There was some slight grade 3 changes of the tibial plateau interestingly overlying the region of the stress reaction in the tibial plateau, it was probed, but no grade 4 changes.  Light chondroplasty performed here.  ACL was unremarkable.  Lateral compartment revealed normal femoral condyle, tibial plateau, and meniscus stable to probe palpation without evidence of tearing.  Suprapatellar pouch revealed that the center portion of the apex the patellar grade 4 lesion.  Chondral flap tears, light chondroplasty performed here.  No grade 4 lesion.  Normal patellofemoral tracking. Gutters were unremarkable.  I reviewed all compartments, no further pathology amenable to arthroscopic intervention.  I, therefore, removed all instrumentation.  Portals were closed with 4-0 nylon simple sutures, 0.25% Marcaine with epinephrine was infiltrated in the joint.  Wound was dressed sterilely.  Awoken without difficulty and transported to the recovery room in a satisfactory condition.  The patient tolerated the procedure well.  No complications.  No assistant.  Minimal blood loss.     Jene Every, M.D.     [name]  D:  05/18/2012  T:  05/18/2012  Job:  [id number]

## 2013-01-22 ENCOUNTER — Other Ambulatory Visit (HOSPITAL_COMMUNITY): Payer: Self-pay | Admitting: Obstetrics & Gynecology

## 2013-01-22 DIAGNOSIS — Z1231 Encounter for screening mammogram for malignant neoplasm of breast: Secondary | ICD-10-CM

## 2013-01-24 ENCOUNTER — Other Ambulatory Visit (HOSPITAL_COMMUNITY): Payer: Self-pay | Admitting: Obstetrics & Gynecology

## 2013-01-24 ENCOUNTER — Ambulatory Visit (HOSPITAL_COMMUNITY)
Admission: RE | Admit: 2013-01-24 | Discharge: 2013-01-24 | Disposition: A | Discharge status: Home / Self Care | Payer: Managed Care, Other (non HMO) | Source: Ambulatory Visit | Attending: Obstetrics & Gynecology | Admitting: Obstetrics & Gynecology

## 2013-01-24 DIAGNOSIS — Z1231 Encounter for screening mammogram for malignant neoplasm of breast: Secondary | ICD-10-CM | POA: Insufficient documentation

## 2013-04-03 ENCOUNTER — Ambulatory Visit: Payer: Managed Care, Other (non HMO) | Admitting: Cardiovascular Disease

## 2013-04-12 ENCOUNTER — Ambulatory Visit: Payer: Managed Care, Other (non HMO) | Admitting: Cardiovascular Disease

## 2013-05-03 ENCOUNTER — Encounter: Payer: Self-pay | Admitting: Cardiovascular Disease

## 2013-05-03 ENCOUNTER — Ambulatory Visit (INDEPENDENT_AMBULATORY_CARE_PROVIDER_SITE_OTHER): Payer: Managed Care, Other (non HMO) | Admitting: Cardiovascular Disease

## 2013-05-03 VITALS — BP 142/90 | HR 72 | Ht 61.0 in | Wt 162.0 lb

## 2013-05-03 DIAGNOSIS — I447 Left bundle-branch block, unspecified: Secondary | ICD-10-CM

## 2013-05-03 DIAGNOSIS — E785 Hyperlipidemia, unspecified: Secondary | ICD-10-CM

## 2013-05-03 DIAGNOSIS — Z79899 Other long term (current) drug therapy: Secondary | ICD-10-CM

## 2013-05-03 DIAGNOSIS — Z8249 Family history of ischemic heart disease and other diseases of the circulatory system: Secondary | ICD-10-CM

## 2013-05-03 NOTE — Patient Instructions (Signed)
  We will see you back in follow up in 1 year with Dr Berry  Dr Berry has ordered blood work to be done, fasting   

## 2013-05-03 NOTE — Assessment & Plan Note (Signed)
On statin therapy followed by her PCP 

## 2013-05-03 NOTE — Progress Notes (Signed)
     05/03/2013 Barbara CarneyDiana Nixon   03/25/1961  [id number]  Primary Physician Farris HasMORROW, AARON, MD Primary Cardiologist: Runell GessJonathan J. 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 I saw her 1-1/2 years ago. Her risk factors include mild hyperlipidemia on a statin drug and family history with a mother that had a myocardial infarction and bypass surgery at age 55. She has chronic right bundle branch block. She is otherwise asymptomatic. She had a Myoview and echo performed in 2010 with were normal. Since I saw her back in a year ago she's remained completely asymptomatic specifically denying chest pain or shortness of breath.    Current Outpatient Prescriptions  Medication Sig Dispense Refill  . acetaminophen (TYLENOL) 500 MG tablet Take 1,000 mg by mouth every 6 (six) hours as needed.      Marland Kitchen. FLUoxetine (PROZAC) 20 MG capsule Take 20 mg by mouth daily.      Marland Kitchen. ibuprofen (ADVIL,MOTRIN) 200 MG tablet Take 200 mg by mouth every 6 (six) hours as needed.      . simvastatin (ZOCOR) 20 MG tablet Take 20 mg by mouth every evening.       No current facility-administered medications for this visit.    Allergies  Allergen Reactions  . Penicillins     REACTION: as child, unspecified    History   Social History  . Marital Status: Married    Spouse Name: N/A    Number of Children: N/A  . Years of Education: N/A   Occupational History  . Not on file.   Social History Main Topics  . Smoking status: Never Smoker   . Smokeless tobacco: Not on file  . Alcohol Use: Yes     Comment: social  . Drug Use:   . Sexual Activity:    Other Topics Concern  . Not on file   Social History Narrative  . No narrative on file     Review of Systems: General: negative for chills, fever, night sweats or weight changes.    Cardiovascular: negative for chest pain, dyspnea on exertion, edema, orthopnea, palpitations, paroxysmal nocturnal dyspnea or shortness of breath Dermatological: negative for rash Respiratory: negative for cough or wheezing Urologic: negative for hematuria Abdominal: negative for nausea, vomiting, diarrhea, bright red blood per rectum, melena, or hematemesis Neurologic: negative for visual changes, syncope, or dizziness All other systems reviewed and are otherwise negative except as noted above.    Blood pressure 142/90, pulse 72, height 5\' 1"  (1.549 m), weight 162 lb (73.483 kg).  General appearance: alert and no distress Neck: no adenopathy, no carotid bruit, no JVD, supple, symmetrical, trachea midline and thyroid not enlarged, symmetric, no tenderness/mass/nodules Lungs: clear to auscultation bilaterally Heart: regular rate and rhythm, S1, S2 normal, no murmur, click, rub or gallop Extremities: extremities normal, atraumatic, no cyanosis or edema  EKG normal sinus rhythm at 72 with left bundle branch block  ASSESSMENT AND PLAN:   Family history of heart disease Patient had a Myoview stress test performed 01/22/09 that showed septal thinning consistent with bundle-branch block artifact without evidence of ischemia. She denies chest pain or shortness of breath.  Hyperlipidemia On statin therapy followed by her PCP      Runell GessJonathan J. Shama Monfils MD [facility] PaFACP,FACC,FAHA, FSCAI 05/03/2013 1:16 PM

## 2013-05-03 NOTE — Assessment & Plan Note (Signed)
Patient had a Myoview stress test performed 01/22/09 that showed septal thinning consistent with bundle-branch block artifact without evidence of ischemia. She denies chest pain or shortness of breath.

## 2013-06-07 LAB — LIPID PANEL
Cholesterol: 252 mg/dL — ABNORMAL HIGH (ref 0–200)
HDL: 75 mg/dL (ref >39)
LDL CALC: 145 mg/dL — AB (ref 0–99)
TRIGLYCERIDES: 161 mg/dL — AB (ref <150)
Total CHOL/HDL Ratio: 3.4 Ratio
VLDL: 32 mg/dL (ref 0–40)

## 2013-06-07 LAB — HEPATIC FUNCTION PANEL
ALBUMIN: 4.1 g/dL (ref 3.5–5.2)
ALK PHOS: 58 U/L (ref 39–117)
ALT: 18 U/L (ref 0–35)
AST: 22 U/L (ref 0–37)
Bilirubin, Direct: 0.1 mg/dL (ref 0.0–0.3)
Indirect Bilirubin: 0.3 mg/dL (ref 0.2–1.2)
TOTAL PROTEIN: 7.2 g/dL (ref 6.0–8.3)
Total Bilirubin: 0.4 mg/dL (ref 0.2–1.2)

## 2013-06-21 ENCOUNTER — Telehealth: Payer: Self-pay | Admitting: *Deleted

## 2013-06-21 ENCOUNTER — Encounter: Payer: Self-pay | Admitting: *Deleted

## 2013-06-21 DIAGNOSIS — E782 Mixed hyperlipidemia: Secondary | ICD-10-CM

## 2013-06-21 DIAGNOSIS — Z79899 Other long term (current) drug therapy: Secondary | ICD-10-CM

## 2013-06-21 MED ORDER — CO Q10 200 MG PO CAPS: 200.0000 mg | ORAL_CAPSULE | Freq: Every day | ORAL | Status: DC

## 2013-06-21 MED ORDER — ROSUVASTATIN CALCIUM 40 MG PO TABS: 40.0000 mg | ORAL_TABLET | Freq: Every day | ORAL | Status: DC

## 2013-06-21 NOTE — Telephone Encounter (Signed)
Message copied by Marella BileVOGEL, Nygeria Lager W. on Fri Jun 21, 2013 12:29 PM ------      Message from: Runell GessBERRY, JONATHAN J      Created: Tue Jun 11, 2013  3:42 PM       On Simva 20 with LDL 145. Change to Crestor 40 and re check. Co Q 10 as well ------

## 2013-06-24 ENCOUNTER — Telehealth: Payer: Self-pay | Admitting: Cardiovascular Disease

## 2013-06-24 NOTE — Telephone Encounter (Signed)
Returning your call. °

## 2013-06-25 NOTE — Telephone Encounter (Signed)
Returning call from last week,thinks it is concerning her lab results.

## 2013-06-25 NOTE — Telephone Encounter (Signed)
Returned call and pt verified x 2.  Pt stated she was calling Dr. Hazle CocaBerry's nurse back about her lab results.  Pt denied receiving letter yet.  Informed letter was mailed w/ results and new lab slip.  Read letter to pt and pt verbalized understanding.  Informed script sent [facility] [address]  Pt verbalized understanding and agreed w/ plan.

## 2013-10-02 ENCOUNTER — Other Ambulatory Visit: Payer: Self-pay | Admitting: *Deleted

## 2013-10-02 MED ORDER — ROSUVASTATIN CALCIUM 40 MG PO TABS: 40.0000 mg | ORAL_TABLET | Freq: Every day | ORAL | Status: DC

## 2013-10-02 NOTE — Telephone Encounter (Signed)
Rx was sent to pharmacy electronically. 

## 2014-02-11 ENCOUNTER — Other Ambulatory Visit: Payer: Self-pay | Admitting: Cardiovascular Disease

## 2014-02-11 NOTE — Telephone Encounter (Signed)
Rx was sent to pharmacy electronically. 

## 2014-06-06 ENCOUNTER — Encounter: Payer: Self-pay | Admitting: Cardiovascular Disease

## 2014-06-06 ENCOUNTER — Ambulatory Visit (INDEPENDENT_AMBULATORY_CARE_PROVIDER_SITE_OTHER): Payer: 59 | Admitting: Cardiovascular Disease

## 2014-06-06 VITALS — BP 124/66 | HR 75 | Ht 65.0 in | Wt 158.9 lb

## 2014-06-06 DIAGNOSIS — R5383 Other fatigue: Secondary | ICD-10-CM

## 2014-06-06 DIAGNOSIS — I209 Angina pectoris, unspecified: Secondary | ICD-10-CM

## 2014-06-06 DIAGNOSIS — Z79899 Other long term (current) drug therapy: Secondary | ICD-10-CM

## 2014-06-06 DIAGNOSIS — Z8249 Family history of ischemic heart disease and other diseases of the circulatory system: Secondary | ICD-10-CM

## 2014-06-06 DIAGNOSIS — R079 Chest pain, unspecified: Secondary | ICD-10-CM

## 2014-06-06 DIAGNOSIS — M545 Low back pain, unspecified: Secondary | ICD-10-CM | POA: Insufficient documentation

## 2014-06-06 DIAGNOSIS — E785 Hyperlipidemia, unspecified: Secondary | ICD-10-CM

## 2014-06-06 MED ORDER — ROSUVASTATIN CALCIUM 40 MG PO TABS: 40.0000 mg | ORAL_TABLET | Freq: Every day | ORAL | Status: DC

## 2014-06-06 NOTE — Patient Instructions (Signed)
Lexiscan Myoview- this is a test that looks at the blood flow to your heart muscle.  It takes approximately 2 1/2 hours. Please follow instruction sheet, as given.  Dr. Allyson SabalBerry has ordered for you to have lab work done and you MUST be FASTING. This should be completed in the next couple of weeks.  Please follow up with Dr. Allyson SabalBerry following the completion of your study.

## 2014-06-06 NOTE — Assessment & Plan Note (Signed)
History of hyperlipidemia on rosuvastatin 40 mg a day. We will recheck a lipid and liver profile

## 2014-06-06 NOTE — Assessment & Plan Note (Signed)
She had 2 episodes of back pain which I'm concerned may be an anginal level. These occurred at night. She does have multiple cardiac risk factors. She's had a Myoview stress test performed 6 years ago and does have a left bundle branch block and had bundle branch block artifact. I'm going to get a formal carotid Myoview stress test to compare with her prior study.

## 2014-06-06 NOTE — Progress Notes (Signed)
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 I saw her 1 year ago. Her risk factors include mild hyperlipidemia on a statin drug and family history with a mother that had a myocardial infarction and bypass surgery at age 35. She Nixon chronic right bundle branch block. She is otherwise asymptomatic. She had a Myoview and echo performed in 2010 with were normal. Since I saw her back in a year ago she's remained completely asymptomatic specifically denying chest pain or shortness of breath although she Nixon had 2 episodes of chest pain that were nocturnal or somewhat concerning for the potential of being an anginal equivalent.   Current Outpatient Prescriptions  Medication Sig Dispense Refill  . acetaminophen (TYLENOL) 500 MG tablet Take 1,000 mg by mouth every 6 (six) hours as needed.    . Coenzyme Q10 (CO Q10) 200 MG CAPS Take 200 mg by mouth daily. 30 capsule 11  . FLUoxetine (PROZAC) 20 MG capsule Take 20 mg by mouth daily.    Marland Kitchen ibuprofen (ADVIL,MOTRIN) 200 MG tablet Take 200 mg by mouth every 6 (six) hours as needed.    . rosuvastatin (CRESTOR) 40 MG tablet Take 1 tablet (40 mg total) by mouth daily. 90 tablet 0   No current facility-administered medications for this visit.    Allergies  Allergen Reactions  . Penicillins     REACTION: as child, unspecified    History   Social History  . Marital Status: Married    Spouse Name: N/A  . Number of Children: N/A  . Years of Education: N/A   Occupational History  . Not on file.   Social History Main Topics  . Smoking status: Never Smoker   . Smokeless tobacco: Not on file  . Alcohol Use: Yes     Comment: social  . Drug  Use: Not on file  . Sexual Activity: Not on file   Other Topics Concern  . Not on file   Social History Narrative     Review of Systems: General: negative for chills, fever, night sweats or weight changes.  Cardiovascular: negative for chest pain, dyspnea on exertion, edema, orthopnea, palpitations, paroxysmal nocturnal dyspnea or shortness of breath Dermatological: negative for rash Respiratory: negative for cough or wheezing Urologic: negative for hematuria Abdominal: negative for nausea, vomiting, diarrhea, bright red blood per rectum, melena, or hematemesis Neurologic: negative for visual changes, syncope, or dizziness All other systems reviewed and are otherwise negative except as noted above.    Blood pressure 124/66, pulse 75, height  (1.651 m), weight 158 lb 14.4 oz (72.077 kg).  General appearance: alert and no distress Neck: no adenopathy, no carotid bruit, no JVD, supple, symmetrical, trachea midline and thyroid not enlarged, symmetric, no tenderness/mass/nodules Lungs: clear to auscultation bilaterally Heart: regular rate and rhythm, S1, S2 normal, no murmur, click, rub or gallop Extremities: extremities normal, atraumatic, no cyanosis or edema  EKG normal sinus rhythm at 75. Branch block unchanged from prior EKGs. I personally reviewed this EKG  ASSESSMENT AND PLAN:   Hyperlipidemia History of hyperlipidemia on rosuvastatin 40 mg a day. We will recheck a lipid and liver profile   Back pain She had 2 episodes of back pain which I'm concerned  may be an anginal level. These occurred at night. She does have multiple cardiac risk factors. She's had a Myoview stress test performed 6 years ago and does have a left bundle branch block and had bundle branch block artifact. I'm going to get a formal carotid Myoview stress test to compare with her prior study.       Barbara GessJonathan J. Berry MD FACP,FACC,FAHA, [facility]SC[facility] 06/06/2014 12:49 PM

## 2014-06-18 ENCOUNTER — Other Ambulatory Visit: Payer: Self-pay | Admitting: Cardiovascular Disease

## 2014-06-18 LAB — CBC WITH DIFFERENTIAL/PLATELET
BASOS ABS: 0 10*3/uL (ref 0.0–0.1)
BASOS PCT: 0 % (ref 0–1)
EOS ABS: 0.1 10*3/uL (ref 0.0–0.7)
EOS PCT: 2 % (ref 0–5)
HCT: 41.9 % (ref 36.0–46.0)
HEMOGLOBIN: 13.5 g/dL (ref 12.0–15.0)
Lymphocytes Relative: 33 % (ref 12–46)
Lymphs Abs: 2.4 10*3/uL (ref 0.7–4.0)
MCH: 27.8 pg (ref 26.0–34.0)
MCHC: 32.2 g/dL (ref 30.0–36.0)
MCV: 86.4 fL (ref 78.0–100.0)
MONO ABS: 0.7 10*3/uL (ref 0.1–1.0)
MONOS PCT: 9 % (ref 3–12)
MPV: 9.3 fL (ref 8.6–12.4)
Neutro Abs: 4.1 10*3/uL (ref 1.7–7.7)
Neutrophils Relative %: 56 % (ref 43–77)
PLATELETS: 333 10*3/uL (ref 150–400)
RBC: 4.85 MIL/uL (ref 3.87–5.11)
RDW: 13.6 % (ref 11.5–15.5)
WBC: 7.3 10*3/uL (ref 4.0–10.5)

## 2014-06-18 LAB — LIPID PANEL
Cholesterol: 154 mg/dL (ref 0–200)
HDL: 64 mg/dL (ref >=46)
LDL CALC: 66 mg/dL (ref 0–99)
TRIGLYCERIDES: 122 mg/dL (ref <150)
Total CHOL/HDL Ratio: 2.4 Ratio
VLDL: 24 mg/dL (ref 0–40)

## 2014-06-18 LAB — HEPATIC FUNCTION PANEL
ALBUMIN: 4.1 g/dL (ref 3.5–5.2)
ALT: 16 U/L (ref 0–35)
AST: 18 U/L (ref 0–37)
Alkaline Phosphatase: 99 U/L (ref 39–117)
BILIRUBIN DIRECT: 0.1 mg/dL (ref 0.0–0.3)
BILIRUBIN TOTAL: 0.4 mg/dL (ref 0.2–1.2)
Indirect Bilirubin: 0.3 mg/dL (ref 0.2–1.2)
Total Protein: 6.8 g/dL (ref 6.0–8.3)

## 2014-06-19 ENCOUNTER — Telehealth (HOSPITAL_COMMUNITY): Payer: Self-pay

## 2014-06-19 NOTE — Telephone Encounter (Signed)
Encounter complete. 

## 2014-06-20 ENCOUNTER — Telehealth (HOSPITAL_COMMUNITY): Payer: Self-pay

## 2014-06-20 NOTE — Telephone Encounter (Signed)
Encounter complete. 

## 2014-06-24 ENCOUNTER — Ambulatory Visit (HOSPITAL_COMMUNITY)
Admission: RE | Admit: 2014-06-24 | Discharge: 2014-06-24 | Disposition: A | Discharge status: Home / Self Care | Payer: 59 | Source: Ambulatory Visit | Attending: Cardiology | Admitting: Cardiology

## 2014-06-24 DIAGNOSIS — R079 Chest pain, unspecified: Secondary | ICD-10-CM | POA: Diagnosis present

## 2014-06-24 MED ORDER — AMINOPHYLLINE 25 MG/ML IV SOLN
75.0000 mg | Freq: Once | INTRAVENOUS | Status: AC
  Administered 2014-06-24: 75 mg via INTRAVENOUS

## 2014-06-24 MED ORDER — TECHNETIUM TC 99M SESTAMIBI GENERIC - CARDIOLITE
10.7000 | Freq: Once | INTRAVENOUS | Status: AC | PRN
  Administered 2014-06-24: 11 via INTRAVENOUS

## 2014-06-24 MED ORDER — REGADENOSON 0.4 MG/5ML IV SOLN
0.4000 mg | Freq: Once | INTRAVENOUS | Status: AC
  Administered 2014-06-24: 0.4 mg via INTRAVENOUS

## 2014-06-24 MED ORDER — AMINOPHYLLINE 25 MG/ML IV SOLN: 75.0000 mg | Freq: Once | INTRAVENOUS | Status: DC

## 2014-06-24 MED ORDER — TECHNETIUM TC 99M SESTAMIBI GENERIC - CARDIOLITE
31.2000 | Freq: Once | INTRAVENOUS | Status: AC | PRN
  Administered 2014-06-24: 31.2 via INTRAVENOUS

## 2014-06-24 NOTE — Procedures (Addendum)
[facility] [address] [place] [address] [phone]  Cardiology Nuclear Med Study  Lupe CarneyDiana Miu is a 54 y.o. female     MRN : [id number]     DOB: 06/28/1960  Procedure Date: 06/24/2014  Nuclear Med Background Indication for Stress Test:  Evaluation for Ischemia History:  RBBB;MV in 2010 per dictationNo prior respiratory history reported. Cardiac Risk Factors: Family History - CAD, Lipids, Overweight and RBBB  Symptoms:  Chest Pain and DOE   Nuclear Pre-Procedure Caffeine/Decaff Intake:  9:00pm NPO After: 5:00am   IV Site: R Forearm  IV 0.9% NS with Angio Cath:  22g  Chest Size (in):  n/a IV Started by: Berdie OgrenAmanda Wease, RN  Height: 5\' 1"  (1.549 m)  Cup Size: B  BMI:  Body mass index is 29.87 kg/(m^2). Weight:  158 lb (71.668 kg)   Tech Comments:  n/a    Nuclear Med Study 1 or 2 day study: 1 day  Stress Test Type:  Lexiscan  Order Authorizing Provider:  Nanetta BattyJonathan Berry, MD   Resting Radionuclide: Technetium 2815m Sestamibi  Resting Radionuclide Dose: 10.7 mCi   Stress Radionuclide:  Technetium 6315m Sestamibi  Stress Radionuclide Dose: 31.2 mCi           Stress Protocol Rest HR: 63 Stress HR: 95  Rest BP: 140/82 Stress BP: 163/77  Exercise Time (min): n/a METS: n/a          Dose of Adenosine (mg):  n/a Dose of Lexiscan: 0.4 mg  Dose of Atropine (mg): n/a Dose of Dobutamine: n/a mcg/kg/min (at max HR)  Stress Test Technologist: Esperanza Sheetserry-Marie Martin, CCT Nuclear Technologist: Gonzella LexPam Phillips, CNMT   Rest Procedure:  Myocardial perfusion imaging was performed at rest 45 minutes following the intravenous administration of Technetium 3015m Sestamibi. Stress Procedure:  The patient received IV Lexiscan 0.4 mg over 15-seconds.  Technetium 6415m Sestamibi injected at 30-seconds.  Patient experienced SOB and Nausea and 75 mg Aminophylline IV was administered. There were no significant changes with Lexiscan.  Quantitative spect images  were obtained after a 45 minute delay.  Transient Ischemic Dilatation (Normal <1.22):  1.22  QGS EDV:  100 ml QGS ESV:  41 ml LV Ejection Fraction: 59%    Rest ECG: NSR-LBBB  Stress ECG: Uninteretable due to baseline LBBB  QPS Raw Data Images:  Acquisition technically good; normal left ventricular size. Stress Images:  There is decreased uptake in the septum. Rest Images:  There is decreased uptake in the septum. Subtraction (SDS):  No evidence of ischemia.  Impression Exercise Capacity:  Lexiscan with no exercise. BP Response:  Normal blood pressure response. Clinical Symptoms:  There is dyspnea. ECG Impression:  Baseline:  LBBB.  EKG uninterpretable due to LBBB at rest and stress. Comparison with Prior Nuclear Study: No previous nuclear study performed  Overall Impression:  Low risk stress nuclear study with a moderate size, medium intensity, fixed septal defect possibly related to patient's LBBB; no ischemia.  LV Wall Motion:  NL LV Function; NL Wall Motion   Olga MillersBrian Anjel Pardo, MD  06/24/2014 5:27 PM

## 2014-07-18 ENCOUNTER — Encounter: Payer: Self-pay | Admitting: *Deleted

## 2014-07-29 ENCOUNTER — Ambulatory Visit: Payer: 59 | Admitting: Cardiovascular Disease

## 2014-08-05 ENCOUNTER — Encounter: Payer: Self-pay | Admitting: Cardiovascular Disease

## 2014-08-05 ENCOUNTER — Ambulatory Visit (INDEPENDENT_AMBULATORY_CARE_PROVIDER_SITE_OTHER): Payer: 59 | Admitting: Cardiovascular Disease

## 2014-08-05 VITALS — BP 118/70 | HR 72 | Ht 61.0 in | Wt 157.9 lb

## 2014-08-05 DIAGNOSIS — E785 Hyperlipidemia, unspecified: Secondary | ICD-10-CM

## 2014-08-05 DIAGNOSIS — I209 Angina pectoris, unspecified: Secondary | ICD-10-CM | POA: Diagnosis not present

## 2014-08-05 DIAGNOSIS — R079 Chest pain, unspecified: Secondary | ICD-10-CM | POA: Insufficient documentation

## 2014-08-05 NOTE — Assessment & Plan Note (Signed)
The patient had 2 episodes of nocturnal chest pain. A recent Myoview stress test performed 06/24/14 showed bundle branch block artifact from her chronic left bundle-branch block similar to a stress test performed 6 years ago. She's had no recurrent symptoms. I have reassured her that it is unlikely that her symptoms were cardiac in nature

## 2014-08-05 NOTE — Assessment & Plan Note (Signed)
History of hyperlipidemia on Crestor 40 mg a day with a recent lipid profile performed 06/18/14 revealing a total cholesterol 154 down from 252, LDL 66 down from 145 and HDL 64. She does complain of some finger arthralgias. I told her to decrease her dose to 20 mg a day for 60 weeks and reassess. If she has continued symptoms she will take a statin holiday and call us back.

## 2014-08-05 NOTE — Progress Notes (Signed)
Ms. Victory DakinRiley returns today for follow-up of her Myoview stress test that showed bundle branch block on fact similar to her prior Myoview in 6 years ago. I reassured her that it is unlikely that her symptoms were ischemic in nature. She also complains of some arthralgias probably related to her high-dose Crestor which we have decided to decrease her dose and reassess.   Runell GessJonathan J. Billye Pickerel, M.D., FACP, [facility] SpringFACC, Earl LagosFAHA, [facility]FSCAI [facility] [address] [place], [place]  [id number]  [phone] 08/05/2014 9:56 AM

## 2014-08-05 NOTE — Patient Instructions (Signed)
Your physician wants you to follow-up in: 1 year with Dr Berry. You will receive a reminder letter in the mail two months in advance. If you don't receive a letter, please call our office to schedule the follow-up appointment.  

## 2014-09-29 ENCOUNTER — Encounter: Payer: Self-pay | Admitting: Internal Medicine

## 2015-05-31 ENCOUNTER — Other Ambulatory Visit: Payer: Self-pay | Admitting: Cardiovascular Disease

## 2015-06-01 NOTE — Telephone Encounter (Signed)
Rx(s) sent to pharmacy electronically.  

## 2016-01-24 ENCOUNTER — Other Ambulatory Visit: Payer: Self-pay | Admitting: Cardiovascular Disease

## 2016-01-25 NOTE — Telephone Encounter (Signed)
Rx request sent to pharmacy.  

## 2016-04-24 ENCOUNTER — Other Ambulatory Visit: Payer: Self-pay | Admitting: Cardiovascular Disease

## 2016-04-26 NOTE — Telephone Encounter (Signed)
Rx has been sent to the pharmacy electronically. ° °

## 2016-07-01 ENCOUNTER — Ambulatory Visit: Payer: 59 | Admitting: Cardiovascular Disease

## 2016-07-13 ENCOUNTER — Ambulatory Visit: Payer: 59 | Admitting: Cardiovascular Disease

## 2016-07-19 ENCOUNTER — Encounter: Payer: Self-pay | Admitting: Cardiovascular Disease

## 2016-07-19 ENCOUNTER — Ambulatory Visit (INDEPENDENT_AMBULATORY_CARE_PROVIDER_SITE_OTHER): Payer: 59 | Admitting: Cardiovascular Disease

## 2016-07-19 VITALS — BP 142/68 | HR 78 | Ht 60.0 in | Wt 156.8 lb

## 2016-07-19 DIAGNOSIS — I447 Left bundle-branch block, unspecified: Secondary | ICD-10-CM

## 2016-07-19 DIAGNOSIS — E7800 Pure hypercholesterolemia, unspecified: Secondary | ICD-10-CM

## 2016-07-19 MED ORDER — ROSUVASTATIN CALCIUM 40 MG PO TABS: 40.0000 mg | ORAL_TABLET | Freq: Every day | ORAL | 3 refills | Status: DC

## 2016-07-19 NOTE — Assessment & Plan Note (Signed)
History of atypical chest pain with negative Myoview in the past thought to be related to bundle branch block. She has not had chest pain since I saw her 2 years ago.

## 2016-07-19 NOTE — Assessment & Plan Note (Signed)
History of hyperlipidemia on Crestor 40 mg a day. We will recheck a lipid panel and liver profile

## 2016-07-19 NOTE — Assessment & Plan Note (Signed)
History of left bundle-branch block which is chronic

## 2016-07-19 NOTE — Patient Instructions (Signed)

## 2016-07-19 NOTE — Progress Notes (Signed)
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 I last saw her 08/05/14. Her risk factors include mild hyperlipidemia on a statin drug and family history with a mother that had a myocardial infarction and bypass surgery at age 65. She has chronic right bundle branch block. She is otherwise asymptomatic. She had a Myoview and echo performed in 2010 with were normal. Since I saw her back in a year ago she's remained completely asymptomatic specifically denying chest pain or shortness of breath although she has had 2 episodes of chest pain that were nocturnal or somewhat concerning for the potential of being an anginal equivalent. She had a Myoview stress test performed 06/24/14 which is low risk and nonischemic. She has had no recurrent symptoms.   Current Outpatient Prescriptions  Medication Sig Dispense Refill  . BIOTIN PO Take 1 capsule by mouth daily.    Marland Kitchen CALCIUM PO Take 1 Dose by mouth daily.    . CYANOCOBALAMIN PO Take 1 Dose by mouth daily.    Marland Kitchen FLUoxetine (PROZAC) 20 MG capsule Take 20 mg by mouth daily.    Marland Kitchen ibuprofen (ADVIL,MOTRIN) 200 MG tablet Take 200 mg by mouth every 6 (six) hours as needed.    Marland Kitchen MAGNESIUM PO Take 1 Dose by mouth daily.    . rosuvastatin (CRESTOR) 40 MG tablet TAKE 1 TABLET DAILY (NEED OFFICE VISIT FOR MORE REFILLS) 90 tablet 0   No current facility-administered medications for this visit.     Allergies  Allergen Reactions  . Penicillins     REACTION: as child, unspecified  . Statins Other (See Comments)    Myalgias     Social History   Social History  . Marital status: Married    Spouse name: N/A  . Number of children: N/A  . Years of  education: N/A   Occupational History  . Not on file.   Social History Main Topics  . Smoking status: Never Smoker  . Smokeless tobacco: Never Used  . Alcohol use 0.0 oz/week     Comment: social  . Drug use: No  . Sexual activity: Not on file   Other Topics Concern  . Not on file   Social History Narrative  . No narrative on file     Review of Systems: General: negative for chills, fever, night sweats or weight changes.  Cardiovascular: negative for chest pain, dyspnea on exertion, edema, orthopnea, palpitations, paroxysmal nocturnal dyspnea or shortness of breath Dermatological: negative for rash Respiratory: negative for cough or wheezing Urologic: negative for hematuria Abdominal: negative for nausea, vomiting, diarrhea, bright red blood per rectum, melena, or hematemesis Neurologic: negative for visual changes, syncope, or dizziness All other systems reviewed and are otherwise negative except as noted above.    Blood pressure (!) 142/68, pulse 78, height 5' (1.524 m), weight 156 lb 12.8 oz (71.1 kg).  General appearance: alert and no distress Neck: no adenopathy, no carotid bruit, no JVD, supple, symmetrical, trachea midline and thyroid not enlarged, symmetric, no tenderness/mass/nodules Lungs: clear to auscultation bilaterally Heart: regular rate and rhythm, S1, S2 normal, no murmur, click, rub or gallop Extremities: extremities normal, atraumatic, no cyanosis or edema  EKG sinus rhythm at 78 with left bundle-branch  block. I personally reviewed this EKG.  ASSESSMENT AND PLAN:   Hyperlipidemia History of hyperlipidemia on Crestor 40 mg a day. We will recheck a lipid panel and liver profile  Left bundle branch block History of left bundle-branch block which is chronic  Chest pain History of atypical chest pain with negative Myoview in the past thought to be related to bundle branch block. She has not had chest pain since I saw her 2 years ago.      Runell GessJonathan  J. Maziah Smola MD FACP,FACC,FAHA, [facility]SCAI 07/19/2016 2:47 PM

## 2016-08-30 ENCOUNTER — Ambulatory Visit: Payer: 59 | Attending: Family Medicine

## 2016-08-30 DIAGNOSIS — M25652 Stiffness of left hip, not elsewhere classified: Secondary | ICD-10-CM | POA: Insufficient documentation

## 2016-08-30 DIAGNOSIS — M25651 Stiffness of right hip, not elsewhere classified: Secondary | ICD-10-CM | POA: Insufficient documentation

## 2016-08-30 DIAGNOSIS — G8929 Other chronic pain: Secondary | ICD-10-CM | POA: Diagnosis present

## 2016-08-30 DIAGNOSIS — R252 Cramp and spasm: Secondary | ICD-10-CM | POA: Diagnosis present

## 2016-08-30 DIAGNOSIS — M545 Low back pain: Secondary | ICD-10-CM | POA: Insufficient documentation

## 2016-08-30 NOTE — Therapy (Signed)
[facility] [address] [place], [place], [address] Phone: [phone]   Fax:  [phone]  Physical Therapy Evaluation  Patient Details  Name: Barbara Nixon MRN: [id number] Date of Birth: 07-15-1960 Referring Provider: Farris Has, MD  Encounter Date: 08/30/2016      PT End of Session - 08/30/16 1314    Visit Number 1   Date for PT Re-Evaluation 10/25/16   PT Start Time 1234   PT Stop Time 1313   PT Time Calculation (min) 39 min   Activity Tolerance Patient tolerated treatment well   Behavior During Therapy [facility] for tasks assessed/performed      Past Medical History:  Diagnosis Date  . Anxiety   . Family history of heart disease   . Hypercholesteremia    takes statin  . Left bundle branch block     Past Surgical History:  Procedure Laterality Date  . KNEE ARTHROSCOPY WITH MEDIAL MENISECTOMY  05/18/2012   Procedure: KNEE ARTHROSCOPY WITH MEDIAL MENISECTOMY;  Surgeon: [name], MD;  Location: [facility];  Service: Orthopedics;  Laterality: Left;  Left Knee Arthroscopy with Partial Medial Menisectomy AND DEBRIDEMENT  . NM MYOCAR PERF WALL MOTION  01/22/2009   normal  . US ECHOCARDIOGRAPHY  01/22/2009   borderline concentric LVH, septal motion is c/wconduction abnormality, mild TR, mild pulmonary hypertension  . WISDOM TOOTH EXTRACTION  unknown    There were no vitals filed for this visit.       Subjective Assessment - 08/30/16 1243    Subjective Pt presents to PT with complaints of Lt sided LBP/buttock pain that began >2 years ago.  Pt has been receiving regular massages without relief.  No incident or injury.     Limitations Walking;Sitting   How long can you walk comfortably? Aleve needed.  Pain with longer periods.   Diagnostic tests none   Patient Stated Goals reudce buttock pain, sit without pain, stand/walk without pain   Currently in Pain? Yes   Pain Score 6    Pain  Location Buttocks   Pain Orientation Left   Pain Descriptors / Indicators Aching;Sore   Pain Type Chronic pain   Pain Frequency Intermittent   Aggravating Factors  sitting, standing/walking, yardwork   Pain Relieving Factors Aleve, stretching, rest            [facility] PT Assessment - 08/30/16 0001      Assessment   Medical Diagnosis Lt low back pain, Lt buttock and thigh pain   Referring Provider Farris Has, MD   Onset Date/Surgical Date 08/30/13   Next MD Visit as needed   Prior Therapy none     Precautions   Precautions None     Restrictions   Weight Bearing Restrictions No     Balance Screen   Has the patient fallen in the past 6 months No   Has the patient had a decrease in activity level because of a fear of falling?  No   Is the patient reluctant to leave their home because of a fear of falling?  No     Home Environment   Living Environment Private residence   Living Arrangements Spouse/significant other   Type of Home House   Home Access Level entry   Home Layout One level     Prior Function   Level of Independence Independent   Vocation Full time employment   Vocation Requirements desk work   Leisure walk with dogs, ride bikes,  yardwork     Cognition   Overall Cognitive Status Within Functional Limits for tasks assessed     Observation/Other Assessments   Focus on Therapeutic Outcomes (FOTO)  46% limitation     Posture/Postural Control   Posture/Postural Control Postural limitations   Postural Limitations Forward head     ROM / Strength   AROM / PROM / Strength AROM;PROM;Strength     AROM   Overall AROM  Deficits   Overall AROM Comments lumbar A/ROM is full with LBP at end range lumbar extension.  Bil. hip ER limited by 50% with pain on the Rt.       PROM   Overall PROM  Deficits   Overall PROM Comments bil hamstrings limited by 25%, ER limited by 50%     Strength   Overall Strength Within functional limits for tasks performed   Overall  Strength Comments 4+/5 bil. hip and knee strength     Palpation   Spinal mobility reduced PA mobility L1-5 with pain L3-5   Palpation comment tenderness over bil. lumbar paraspinals, Rt>Lt gluteals including gluteus medius, and piriformis     Transfers   Transfers Independent with all Transfers     Ambulation/Gait   Ambulation/Gait Yes   Ambulation/Gait Assistance 7: Independent                           PT Education - 08/30/16 1303    Education provided Yes   Education Details hip flexibility, lumbar flexibility   Person(s) Educated Patient   Methods Explanation;Demonstration;Handout   Comprehension Verbalized understanding          PT Short Term Goals - 08/30/16 1318      PT SHORT TERM GOAL #1   Title be independent in initial HEP   Time 4   Period Weeks   Status New     PT SHORT TERM GOAL #2   Title report a 30% reduction in lumbar and glutal pain with standing and walking   Time 4   Period Weeks   Status New     PT SHORT TERM GOAL #3   Title report < or = to 4/10 gluteal pain with yardwork   Time 4   Period Weeks   Status New           PT Long Term Goals - 08/30/16 1234      PT LONG TERM GOAL #1   Title be independent in advanced HEP   Time 8   Period Weeks   Status New     PT LONG TERM GOAL #2   Title reduce FOTO to < or = to 36% limitation   Time 8   Period Weeks   Status New     PT LONG TERM GOAL #3   Title verbalize and demonstrate correct body mechanics techniques for spinal protection    Time 8   Period Weeks   Status New     PT LONG TERM GOAL #4   Title report a 75% reduction in lumbar and gluteal pain with standing and walking   Time 8   Period Weeks   Status New               Plan - 08/30/16 1315    Clinical Impression Statement Pt presents to PT with Lt sided buttock pain and low back pain that began more than 2 years ago.  Pt demonstrates bil hip stiffness into IR and ER  with pain on the Rt>Lt,  trigger points in bil lumbar paraspinals and Rt>Lt buttock, reduced lumbar segmental mobility.  Pt is a low complexity evaluation due to lack of comorbidities.  Pt will benefit from skilled PT for core strength, hip strength and flexibility, manual and dry needling to lumbar and hips and body mechanics education.   Rehab Potential Good   PT Frequency 2x / week   PT Duration 8 weeks   PT Treatment/Interventions ADLs/Self Care Home Management;Cryotherapy;Electrical Stimulation;Iontophoresis 4mg /ml Dexamethasone;Functional mobility training;Ultrasound;Moist Heat;Traction;Therapeutic activities;Therapeutic exercise;Neuromuscular re-education;Patient/family education;Passive range of motion;Manual techniques;Dry needling;Taping   PT Next Visit Plan dry needling to lumbar and bil. gluteals, hip flexibility, gluteal strength, core strength, body mechanics education   Consulted and Agree with Plan of Care Patient      Patient will benefit from skilled therapeutic intervention in order to improve the following deficits and impairments:  Postural dysfunction, Hypomobility, Impaired flexibility, Improper body mechanics, Pain, Decreased activity tolerance, Increased muscle spasms, Decreased range of motion  Visit Diagnosis: Chronic left-sided low back pain without sciatica - Plan: PT plan of care cert/re-cert  Cramp and spasm - Plan: PT plan of care cert/re-cert  Stiffness of left hip, not elsewhere classified - Plan: PT plan of care cert/re-cert  Stiffness of right hip, not elsewhere classified - Plan: PT plan of care cert/re-cert     Problem List Patient Active Problem List   Diagnosis Date Noted  . Chest pain 08/05/2014  . Back pain 06/06/2014  . Hyperlipidemia 05/03/2013  . Left bundle branch block 05/03/2013  . Family history of heart disease 05/03/2013     Lorrene ReidKelly Takacs, PT 08/30/16 1:23 PM  [facility] [address], [address] [place], [place], [address] Phone: [phone]   Fax:  [phone]  Name: Barbara Nixon MRN: [id number] Date of Birth: 01/15/1961

## 2016-08-30 NOTE — Patient Instructions (Addendum)
Perform all exercises below:  Hold _20___ seconds. Repeat _3___ times.  Do __3__ sessions per day. CAUTION: Movement should be gentle, steady and slow.  Knee to Chest  Lying supine, bend involved knee to chest. Perform with each leg.  Copyright  VHI. All rights reserved.   Piriformis Stretch - Supine    Pull uninvolved knee across body toward opposite shoulder. Hold slight stretch for _20_ seconds. Repeat with involved leg. Repeat _3__ times. Do __3_ times per day.  Copyright  VHI. All rights reserved.  Piriformis Stretch, Sitting    Sit, one ankle on opposite knee, same-side hand on crossed knee. Push down on knee, keeping spine straight. Lean torso forward, with flat back, until tension is felt in hamstrings and gluteals of crossed-leg side. Hold _20__ seconds.  Repeat 3___ times per session. Do _3__ sessions per day.  Copyright  VHI. All rights reserved.    Lumbar Rotation: Caudal - Bilateral (Supine)  Feet and knees together, arms outstretched, rotate knees left, turning head in opposite direction, until stretch is felt.      HIP: Hamstrings - Short Sitting   Rest leg on raised surface. Keep knee straight. Lift chest.   [facility] [address], [address] [place], [place] [address] Phone # [phone] Fax [phone]

## 2016-09-01 ENCOUNTER — Ambulatory Visit: Payer: 59

## 2016-09-01 DIAGNOSIS — M25651 Stiffness of right hip, not elsewhere classified: Secondary | ICD-10-CM

## 2016-09-01 DIAGNOSIS — M545 Low back pain, unspecified: Secondary | ICD-10-CM

## 2016-09-01 DIAGNOSIS — M25652 Stiffness of left hip, not elsewhere classified: Secondary | ICD-10-CM

## 2016-09-01 DIAGNOSIS — R252 Cramp and spasm: Secondary | ICD-10-CM

## 2016-09-01 DIAGNOSIS — G8929 Other chronic pain: Secondary | ICD-10-CM

## 2016-09-01 NOTE — Patient Instructions (Addendum)

## 2016-09-01 NOTE — Therapy (Signed)
[facility] [address] [place], [place], [address] Phone: [phone]   Fax:  [phone]  Physical Therapy Treatment  Patient Details  Name: Barbara Nixon MRN: [id number] Date of Birth: 08-Mar-1961 Referring Provider: Farris Has, MD  Encounter Date: 09/01/2016      PT End of Session - 09/01/16 1307    Visit Number 2   Date for PT Re-Evaluation 10/25/16   PT Start Time 1231  dry needling   PT Stop Time 1317   PT Time Calculation (min) 46 min   Activity Tolerance Patient tolerated treatment well   Behavior During Therapy [facility] for tasks assessed/performed      Past Medical History:  Diagnosis Date  . Anxiety   . Family history of heart disease   . Hypercholesteremia    takes statin  . Left bundle branch block     Past Surgical History:  Procedure Laterality Date  . KNEE ARTHROSCOPY WITH MEDIAL MENISECTOMY  05/18/2012   Procedure: KNEE ARTHROSCOPY WITH MEDIAL MENISECTOMY;  Surgeon: [name], MD;  Location: [facility];  Service: Orthopedics;  Laterality: Left;  Left Knee Arthroscopy with Partial Medial Menisectomy AND DEBRIDEMENT  . NM MYOCAR PERF WALL MOTION  01/22/2009   normal  . US ECHOCARDIOGRAPHY  01/22/2009   borderline concentric LVH, septal motion is c/wconduction abnormality, mild TR, mild pulmonary hypertension  . WISDOM TOOTH EXTRACTION  unknown    There were no vitals filed for this visit.      Subjective Assessment - 09/01/16 1228    Subjective Pt is doing well with the stretches.  Ready to try dry needling.     Patient Stated Goals reudce buttock pain, sit without pain, stand/walk without pain   Currently in Pain? Yes   Pain Location Buttocks   Pain Orientation Left   Pain Descriptors / Indicators Aching;Sore                         [facility] Adult PT Treatment/Exercise - 09/01/16 0001      Exercises   Exercises Knee/Hip;Lumbar     Lumbar  Exercises: Stretches   Active Hamstring Stretch 3 reps;20 seconds   Single Knee to Chest Stretch 3 reps;20 seconds   Piriformis Stretch 3 reps;20 seconds     Modalities   Modalities Moist Heat     Moist Heat Therapy   Number Minutes Moist Heat 10 Minutes   Moist Heat Location Lumbar Spine  gluteals     Manual Therapy   Manual Therapy Soft tissue mobilization;Myofascial release   Manual therapy comments soft tissue elongation to bil gluteals with trigger point release with pt in sidelying.          Trigger Point Dry Needling - 09/01/16 1301    Consent Given? Yes   Education Handout Provided Yes   Muscles Treated Lower Body Piriformis;Gluteus minimus;Gluteus maximus  Rt and LT   Gluteus Maximus Response Twitch response elicited;Palpable increased muscle length   Gluteus Minimus Response Twitch response elicited;Palpable increased muscle length   Piriformis Response Twitch response elicited;Palpable increased muscle length              PT Education - 09/01/16 1231    Education provided Yes   Education Details DN info   Person(s) Educated Patient   Methods Explanation;Handout   Comprehension Verbalized understanding          PT Short Term Goals - 08/30/16 1318  PT SHORT TERM GOAL #1   Title be independent in initial HEP   Time 4   Period Weeks   Status New     PT SHORT TERM GOAL #2   Title report a 30% reduction in lumbar and glutal pain with standing and walking   Time 4   Period Weeks   Status New     PT SHORT TERM GOAL #3   Title report < or = to 4/10 gluteal pain with yardwork   Time 4   Period Weeks   Status New           PT Long Term Goals - 08/30/16 1234      PT LONG TERM GOAL #1   Title be independent in advanced HEP   Time 8   Period Weeks   Status New     PT LONG TERM GOAL #2   Title reduce FOTO to < or = to 36% limitation   Time 8   Period Weeks   Status New     PT LONG TERM GOAL #3   Title verbalize and demonstrate  correct body mechanics techniques for spinal protection    Time 8   Period Weeks   Status New     PT LONG TERM GOAL #4   Title report a 75% reduction in lumbar and gluteal pain with standing and walking   Time 8   Period Weeks   Status New               Plan - 09/01/16 1307    Clinical Impression Statement Pt with only 1 session after evaluation.  Pt with active trigger points in bil gluteals and demonstrated improved tissue mobility and reduced pain after dry needling and manual therapy today.  Pt will continue with stretching at home.  Pt will continue to benefit from skilled PT for bil hip flexibility, core and hip strength and manual/dry needling to reduce tension and trigger points.     Rehab Potential Good   PT Frequency 2x / week   PT Duration 8 weeks   PT Treatment/Interventions ADLs/Self Care Home Management;Cryotherapy;Electrical Stimulation;Iontophoresis 4mg /ml Dexamethasone;Functional mobility training;Ultrasound;Moist Heat;Traction;Therapeutic activities;Therapeutic exercise;Neuromuscular re-education;Patient/family education;Passive range of motion;Manual techniques;Dry needling;Taping   PT Next Visit Plan assess response to dry needling bil. gluteals, hip flexibility, gluteal strength, core strength, body mechanics education   Consulted and Agree with Plan of Care Patient      Patient will benefit from skilled therapeutic intervention in order to improve the following deficits and impairments:  Postural dysfunction, Hypomobility, Impaired flexibility, Improper body mechanics, Pain, Decreased activity tolerance, Increased muscle spasms, Decreased range of motion  Visit Diagnosis: Chronic left-sided low back pain without sciatica  Cramp and spasm  Stiffness of left hip, not elsewhere classified  Stiffness of right hip, not elsewhere classified     Problem List Patient Active Problem List   Diagnosis Date Noted  . Chest pain 08/05/2014  . Back pain  06/06/2014  . Hyperlipidemia 05/03/2013  . Left bundle branch block 05/03/2013  . Family history of heart disease 05/03/2013     Lorrene ReidKelly Takacs, PT 09/01/16 1:10 PM  [facility] [address], [address] [place], [place], [address] Phone: [phone]   Fax:  [phone]  Name: Lupe CarneyDiana Cottone MRN: [id number] Date of Birth: 08/16/1960

## 2016-09-05 ENCOUNTER — Ambulatory Visit: Payer: 59 | Admitting: Physical Therapy

## 2016-09-05 ENCOUNTER — Encounter: Payer: Self-pay | Admitting: Physical Therapy

## 2016-09-05 DIAGNOSIS — G8929 Other chronic pain: Secondary | ICD-10-CM

## 2016-09-05 DIAGNOSIS — R252 Cramp and spasm: Secondary | ICD-10-CM

## 2016-09-05 DIAGNOSIS — M545 Low back pain, unspecified: Secondary | ICD-10-CM

## 2016-09-05 DIAGNOSIS — M25651 Stiffness of right hip, not elsewhere classified: Secondary | ICD-10-CM

## 2016-09-05 DIAGNOSIS — M25652 Stiffness of left hip, not elsewhere classified: Secondary | ICD-10-CM

## 2016-09-05 NOTE — Therapy (Signed)
[facility] [address] [place], [place], [address] Phone: [phone]   Fax:  [phone]  Physical Therapy Treatment  Patient Details  Name: Barbara Nixon MRN: [id number] Date of Birth: September 30, 1960 Referring Provider: Farris Has, MD  Encounter Date: 09/05/2016      PT End of Session - 09/05/16 1245    Visit Number 3   Date for PT Re-Evaluation 10/25/16   PT Start Time 1229   PT Stop Time (P)  1311   PT Time Calculation (min) (P)  42 min   Activity Tolerance Patient tolerated treatment well   Behavior During Therapy [facility] for tasks assessed/performed      Past Medical History:  Diagnosis Date  . Anxiety   . Family history of heart disease   . Hypercholesteremia    takes statin  . Left bundle branch block     Past Surgical History:  Procedure Laterality Date  . KNEE ARTHROSCOPY WITH MEDIAL MENISECTOMY  05/18/2012   Procedure: KNEE ARTHROSCOPY WITH MEDIAL MENISECTOMY;  Surgeon: [name], MD;  Location: [facility];  Service: Orthopedics;  Laterality: Left;  Left Knee Arthroscopy with Partial Medial Menisectomy AND DEBRIDEMENT  . NM MYOCAR PERF WALL MOTION  01/22/2009   normal  . US ECHOCARDIOGRAPHY  01/22/2009   borderline concentric LVH, septal motion is c/wconduction abnormality, mild TR, mild pulmonary hypertension  . WISDOM TOOTH EXTRACTION  unknown    There were no vitals filed for this visit.      Subjective Assessment - 09/05/16 1232    Subjective Felt the dry needling was helpful. Pain is lessening. Doing her stretches at home.    Limitations Walking;Sitting   How long can you walk comfortably? Aleve needed.  Pain with longer periods.   Diagnostic tests none   Patient Stated Goals reudce buttock pain, sit without pain, stand/walk without pain   Currently in Pain? Yes   Pain Score 4    Pain Location Buttocks   Pain Orientation Left   Pain Descriptors / Indicators  Sore;Tender   Aggravating Factors  Sitting, standing yardwork   Pain Relieving Factors Aleve, rest   Multiple Pain Sites No                         [facility] Adult PT Treatment/Exercise - 09/05/16 0001      Therapeutic Activites    Therapeutic Activities --  Yardwork lumbar supportive body mecahincs     Lumbar Exercises: Stretches   Single Knee to Chest Stretch 3 reps;20 seconds   Single Knee to Chest Stretch Limitations Knee to opposite shoulder 3x 20 sec   Piriformis Stretch 3 reps;20 seconds   Piriformis Stretch Limitations Supine lumbar release with foam roll  Leg lengthener stretch bil with TA contraction     Lumbar Exercises: Supine   Ab Set --  TA contraction in spine 6x   Other Supine Lumbar Exercises piriformis release with orange spikey ball   Added slow LT hip ER 10x                  PT Short Term Goals - 09/05/16 1242      PT SHORT TERM GOAL #1   Title be independent in initial HEP   Time 4   Period Weeks   Status Achieved     PT SHORT TERM GOAL #2   Title report a 30% reduction in lumbar and glutal pain with standing and  walking   Time 4   Period Weeks   Status On-going     PT SHORT TERM GOAL #3   Title report < or = to 4/10 gluteal pain with yardwork   Time 4   Period Weeks   Status On-going           PT Long Term Goals - 08/30/16 1234      PT LONG TERM GOAL #1   Title be independent in advanced HEP   Time 8   Period Weeks   Status New     PT LONG TERM GOAL #2   Title reduce FOTO to < or = to 36% limitation   Time 8   Period Weeks   Status New     PT LONG TERM GOAL #3   Title verbalize and demonstrate correct body mechanics techniques for spinal protection    Time 8   Period Weeks   Status New     PT LONG TERM GOAL #4   Title report a 75% reduction in lumbar and gluteal pain with standing and walking   Time 8   Period Weeks   Status New               Plan - 09/05/16 1237    Clinical Impression  Statement Pt stated she felt some better after the dry needling session. Her pain is gradually decreasing but still present.  Pt participated in Lt hip and lumbar muscular release techniques in addition to her introduction to core strengthening.  Pt reported no pain at the end of her session today.    Rehab Potential Good   PT Frequency 2x / week   PT Duration 8 weeks   PT Treatment/Interventions ADLs/Self Care Home Management;Cryotherapy;Electrical Stimulation;Iontophoresis 4mg /ml Dexamethasone;Functional mobility training;Ultrasound;Moist Heat;Traction;Therapeutic activities;Therapeutic exercise;Neuromuscular re-education;Patient/family education;Passive range of motion;Manual techniques;Dry needling;Taping   PT Next Visit Plan Dry needling #2, gluteal stretching and release techniques, pt is going to see what changes she can make to her work station so she can sit less.    Consulted and Agree with Plan of Care Patient      Patient will benefit from skilled therapeutic intervention in order to improve the following deficits and impairments:  Postural dysfunction, Hypomobility, Impaired flexibility, Improper body mechanics, Pain, Decreased activity tolerance, Increased muscle spasms, Decreased range of motion  Visit Diagnosis: Chronic left-sided low back pain without sciatica  Cramp and spasm  Stiffness of left hip, not elsewhere classified  Stiffness of right hip, not elsewhere classified     Problem List Patient Active Problem List   Diagnosis Date Noted  . Chest pain 08/05/2014  . Back pain 06/06/2014  . Hyperlipidemia 05/03/2013  . Left bundle branch block 05/03/2013  . Family history of heart disease 05/03/2013    Zophia Marrone, PTA 09/05/2016, 3:47 PM  [facility] [address], [address] [place], [place], [address] Phone: [phone]   Fax:  [phone]  Name: Lupe CarneyDiana Pebley MRN: [id number] Date of Birth:  03/14/1961

## 2016-09-14 ENCOUNTER — Ambulatory Visit: Payer: 59

## 2016-09-14 DIAGNOSIS — M545 Low back pain, unspecified: Secondary | ICD-10-CM

## 2016-09-14 DIAGNOSIS — M25651 Stiffness of right hip, not elsewhere classified: Secondary | ICD-10-CM

## 2016-09-14 DIAGNOSIS — M25652 Stiffness of left hip, not elsewhere classified: Secondary | ICD-10-CM

## 2016-09-14 DIAGNOSIS — G8929 Other chronic pain: Secondary | ICD-10-CM

## 2016-09-14 DIAGNOSIS — R252 Cramp and spasm: Secondary | ICD-10-CM

## 2016-09-14 NOTE — Therapy (Signed)
[facility] [address], [address] [place], [place], [address] Phone: [phone]   Fax:  [phone]  Physical Therapy Treatment  Patient Details  Name: Barbara Nixon MRN: [id number] Date of Birth: 07/01/1960 Referring Provider: Farris HasMorrow, Aaron, MD  Encounter Date: 09/14/2016      PT End of Session - 09/14/16 1305    Visit Number 4   Date for PT Re-Evaluation 10/25/16   PT Start Time 1228  dry needling   PT Stop Time 1315   PT Time Calculation (min) 47 min   Activity Tolerance Patient tolerated treatment well   Behavior During Therapy [facility] for tasks assessed/performed      Past Medical History:  Diagnosis Date  . Anxiety   . Family history of heart disease   . Hypercholesteremia    takes statin  . Left bundle branch block     Past Surgical History:  Procedure Laterality Date  . KNEE ARTHROSCOPY WITH MEDIAL MENISECTOMY  05/18/2012   Procedure: KNEE ARTHROSCOPY WITH MEDIAL MENISECTOMY;  Surgeon: Javier DockerJeffrey C Beane, MD;  [facility] ;  Service: Orthopedics;  Laterality: Left;  Left Knee Arthroscopy with Partial Medial Menisectomy AND DEBRIDEMENT  . NM MYOCAR PERF WALL MOTION  01/22/2009   normal  . US ECHOCARDIOGRAPHY  01/22/2009   borderline concentric LVH, septal motion is c/wconduction abnormality, mild TR, mild pulmonary hypertension  . WISDOM TOOTH EXTRACTION  unknown    There were no vitals filed for this visit.      Subjective Assessment - 09/14/16 1228    Subjective I was feeling good.  Went to [employer] my beach house and had to shovel sand and clean the house.  Pain increased after this activity.     Patient Stated Goals reudce buttock pain, sit without pain, stand/walk without pain   Currently in Pain? Yes   Pain Score 7    Pain Location Buttocks   Pain Orientation Left;Right   Pain Descriptors / Indicators Sore;Tender   Pain Type Chronic pain   Pain Onset More than a month ago   Pain  Frequency Intermittent   Aggravating Factors  sitting, standing, yardwork   Pain Relieving Factors Aleve, rest                         [facility] Adult PT Treatment/Exercise - 09/14/16 0001      Lumbar Exercises: Stretches   Single Knee to Chest Stretch 3 reps;20 seconds   Single Knee to Chest Stretch Limitations Knee to opposite shoulder 3x 20 sec     Knee/Hip Exercises: Aerobic   Nustep Level 1 x 6 minutes  PT present to discuss progress     Modalities   Modalities Moist Heat     Moist Heat Therapy   Number Minutes Moist Heat 10 Minutes   Moist Heat Location Lumbar Spine     Manual Therapy   Manual Therapy Soft tissue mobilization;Myofascial release   Manual therapy comments soft tissue elongation to bil gluteals with trigger point release with pt in sidelying.          Trigger Point Dry Needling - 09/14/16 1251    Consent Given? Yes   Muscles Treated Lower Body Gluteus minimus;Gluteus maximus;Piriformis  Rt and LT   Gluteus Maximus Response Twitch response elicited;Palpable increased muscle length   Gluteus Minimus Response Twitch response elicited;Palpable increased muscle length   Piriformis Response Twitch response elicited;Palpable increased muscle length  PT Short Term Goals - 09/14/16 1231      PT SHORT TERM GOAL #1   Title be independent in initial HEP   Status Achieved     PT SHORT TERM GOAL #2   Title report a 30% reduction in lumbar and glutal pain with standing and walking   Time 4   Period Weeks   Status On-going     PT SHORT TERM GOAL #3   Title report < or = to 4/10 gluteal pain with yardwork   Time 4   Period Weeks   Status On-going           PT Long Term Goals - 08/30/16 1234      PT LONG TERM GOAL #1   Title be independent in advanced HEP   Time 8   Period Weeks   Status New     PT LONG TERM GOAL #2   Title reduce FOTO to < or = to 36% limitation   Time 8   Period Weeks   Status New      PT LONG TERM GOAL #3   Title verbalize and demonstrate correct body mechanics techniques for spinal protection    Time 8   Period Weeks   Status New     PT LONG TERM GOAL #4   Title report a 75% reduction in lumbar and gluteal pain with standing and walking   Time 8   Period Weeks   Status New               Plan - 09/14/16 1232    Clinical Impression Statement Pt was feeling better and then had increased activity with shoveling sand and doing yardwork.  Pt with tension and trigger points in bil. gluteals and demostrated improved tissue mobility and reduced pain after dry needling today.  Pt will continue to benefit from skilled PT for strength, flexibility and manual therapy for pain.     Rehab Potential Good   PT Frequency 2x / week   PT Duration 8 weeks   PT Treatment/Interventions ADLs/Self Care Home Management;Cryotherapy;Electrical Stimulation;Iontophoresis 4mg /ml Dexamethasone;Functional mobility training;Ultrasound;Moist Heat;Traction;Therapeutic activities;Therapeutic exercise;Neuromuscular re-education;Patient/family education;Passive range of motion;Manual techniques;Dry needling;Taping   PT Next Visit Plan Dry needling #3, gluteal stretching and release techniques, Core strength, hip strength      Patient will benefit from skilled therapeutic intervention in order to improve the following deficits and impairments:  Postural dysfunction, Hypomobility, Impaired flexibility, Improper body mechanics, Pain, Decreased activity tolerance, Increased muscle spasms, Decreased range of motion  Visit Diagnosis: Chronic left-sided low back pain without sciatica  Cramp and spasm  Stiffness of left hip, not elsewhere classified  Stiffness of right hip, not elsewhere classified     Problem List Patient Active Problem List   Diagnosis Date Noted  . Chest pain 08/05/2014  . Back pain 06/06/2014  . Hyperlipidemia 05/03/2013  . Left bundle branch block 05/03/2013  . Family  history of heart disease 05/03/2013    Lorrene Reid, PT 09/14/16 1:07 PM  [facility] [address] [place], [place], [address] Phone: [phone]   Fax:  [phone]  Name: Barbara Nixon MRN: [id number] Date of Birth: January 27, 1961

## 2016-09-20 ENCOUNTER — Ambulatory Visit: Payer: 59

## 2016-09-20 DIAGNOSIS — M25651 Stiffness of right hip, not elsewhere classified: Secondary | ICD-10-CM

## 2016-09-20 DIAGNOSIS — G8929 Other chronic pain: Secondary | ICD-10-CM

## 2016-09-20 DIAGNOSIS — M545 Low back pain: Secondary | ICD-10-CM | POA: Diagnosis not present

## 2016-09-20 DIAGNOSIS — R252 Cramp and spasm: Secondary | ICD-10-CM

## 2016-09-20 DIAGNOSIS — M25652 Stiffness of left hip, not elsewhere classified: Secondary | ICD-10-CM

## 2016-09-20 NOTE — Patient Instructions (Addendum)
Abduction: Clam (Eccentric) - Side-Lying    Lie on side with knees bent. Lift top knee, keeping feet together. Keep trunk steady.2x10 each side.  1-2x/day  http://ecce.exer.us/65   Copyright  VHI. All rights reserved.  Bridge    Lie back, legs bent. Inhale, pressing hips up. Keeping ribs in, lengthen lower back. Exhale, rolling down along spine from top. Repeat _2x10___ times. Do __1-2__ sessions per day.  http://pm.exer.us/55   Copyright  VHI. All rights reserved.  [facility] [address], [address] [place], [place] [address] Phone # [phone] Fax [phone]

## 2016-09-20 NOTE — Therapy (Signed)
[facility] [address], [address] [place], [place], [address] Phone: [phone]   Fax:  [phone]  Physical Therapy Treatment  Patient Details  Name: Barbara Nixon MRN: [id number] Date of Birth: 03-23-1961 Referring Provider: Farris Has, MD  Encounter Date: 09/20/2016      PT End of Session - 09/20/16 1314    Visit Number 5   Date for PT Re-Evaluation 10/25/16   PT Start Time 1232   PT Stop Time 1312   PT Time Calculation (min) 40 min   Activity Tolerance Patient tolerated treatment well   Behavior During Therapy [facility] for tasks assessed/performed      Past Medical History:  Diagnosis Date  . Anxiety   . Family history of heart disease   . Hypercholesteremia    takes statin  . Left bundle branch block     Past Surgical History:  Procedure Laterality Date  . KNEE ARTHROSCOPY WITH MEDIAL MENISECTOMY  05/18/2012   Procedure: KNEE ARTHROSCOPY WITH MEDIAL MENISECTOMY;  Surgeon: [name], MD;  Location: [facility];  Service: Orthopedics;  Laterality: Left;  Left Knee Arthroscopy with Partial Medial Menisectomy AND DEBRIDEMENT  . NM MYOCAR PERF WALL MOTION  01/22/2009   normal  . US ECHOCARDIOGRAPHY  01/22/2009   borderline concentric LVH, septal motion is c/wconduction abnormality, mild TR, mild pulmonary hypertension  . WISDOM TOOTH EXTRACTION  unknown    There were no vitals filed for this visit.      Subjective Assessment - 09/20/16 1234    Subjective I feel OK.  Having trouble with sleep.   Patient Stated Goals reudce buttock pain, sit without pain, stand/walk without pain   Currently in Pain? Yes   Pain Score 5    Pain Location Buttocks   Pain Orientation Right;Left   Pain Descriptors / Indicators Aching   Pain Type Chronic pain   Pain Onset More than a month ago   Pain Frequency Intermittent   Aggravating Factors  sitting, standing, yardwork   Pain Relieving Factors Aleve, heat                          [facility] Adult PT Treatment/Exercise - 09/20/16 0001      Lumbar Exercises: Stretches   Single Knee to Chest Stretch 3 reps;20 seconds   Single Knee to Chest Stretch Limitations Knee to opposite shoulder 3x 20 sec   Piriformis Stretch 3 reps;20 seconds     Lumbar Exercises: Supine   Bridge 20 reps;5 seconds  with ball squeeze   Other Supine Lumbar Exercises piriformis release with orange spikey ball   Added slow LT hip ER 10x     Lumbar Exercises: Sidelying   Clam 20 reps  with abdominal bracing     Manual Therapy   Manual Therapy Soft tissue mobilization;Myofascial release   Manual therapy comments orange roller and use of reflex ball to mobilize tissue                PT Education - 09/20/16 1313    Education provided Yes   Education Details bridge, clamshells   Person(s) Educated Patient   Methods Explanation;Demonstration;Handout   Comprehension Verbalized understanding;Returned demonstration          PT Short Term Goals - 09/20/16 1236      PT SHORT TERM GOAL #2   Title report a 30% reduction in lumbar and glutal pain with standing and walking   Baseline  10%   Time 4   Period Weeks   Status On-going     PT SHORT TERM GOAL #3   Title report < or = to 4/10 gluteal pain with yardwork   Time 4   Period Weeks   Status On-going           PT Long Term Goals - 08/30/16 1234      PT LONG TERM GOAL #1   Title be independent in advanced HEP   Time 8   Period Weeks   Status New     PT LONG TERM GOAL #2   Title reduce FOTO to < or = to 36% limitation   Time 8   Period Weeks   Status New     PT LONG TERM GOAL #3   Title verbalize and demonstrate correct body mechanics techniques for spinal protection    Time 8   Period Weeks   Status New     PT LONG TERM GOAL #4   Title report a 75% reduction in lumbar and gluteal pain with standing and walking   Time 8   Period Weeks   Status New                Plan - 09/20/16 1237    Clinical Impression Statement Pt reports 10% overall improvement in gluteal symptoms since the start of care.  Pt with inability to sleep due to pain at times.  Pt with trigger points in bil gluteals and PT focused on flexibility, strength and tissue mobilization today.  Pt not sure if dry needling is helping so held on this today.  Pt will continue to benefit from skilled PT for strength, flexibility and manual therapy.   Rehab Potential Good   PT Frequency 2x / week   PT Duration 8 weeks   PT Treatment/Interventions ADLs/Self Care Home Management;Cryotherapy;Electrical Stimulation;Iontophoresis 4mg /ml Dexamethasone;Functional mobility training;Ultrasound;Moist Heat;Traction;Therapeutic activities;Therapeutic exercise;Neuromuscular re-education;Patient/family education;Passive range of motion;Manual techniques;Dry needling;Taping   PT Next Visit Plan Dry needling #3 next week, gluteal stretching and release techniques, Core strength, hip strength   Consulted and Agree with Plan of Care Patient      Patient will benefit from skilled therapeutic intervention in order to improve the following deficits and impairments:  Postural dysfunction, Hypomobility, Impaired flexibility, Improper body mechanics, Pain, Decreased activity tolerance, Increased muscle spasms, Decreased range of motion  Visit Diagnosis: Chronic left-sided low back pain without sciatica  Cramp and spasm  Stiffness of left hip, not elsewhere classified  Stiffness of right hip, not elsewhere classified     Problem List Patient Active Problem List   Diagnosis Date Noted  . Chest pain 08/05/2014  . Back pain 06/06/2014  . Hyperlipidemia 05/03/2013  . Left bundle branch block 05/03/2013  . Family history of heart disease 05/03/2013     Lorrene ReidKelly Takacs, PT 09/20/16 1:15 PM  [facility] [address], [address] [place], [place], [address] Phone:  [phone]   Fax:  [phone]  Name: Barbara Nixon MRN: [id number] Date of Birth: 09/16/1960

## 2016-09-26 ENCOUNTER — Ambulatory Visit: Payer: 59 | Attending: Family Medicine

## 2016-09-26 DIAGNOSIS — G8929 Other chronic pain: Secondary | ICD-10-CM | POA: Insufficient documentation

## 2016-09-26 DIAGNOSIS — M25651 Stiffness of right hip, not elsewhere classified: Secondary | ICD-10-CM | POA: Insufficient documentation

## 2016-09-26 DIAGNOSIS — R252 Cramp and spasm: Secondary | ICD-10-CM | POA: Insufficient documentation

## 2016-09-26 DIAGNOSIS — M25652 Stiffness of left hip, not elsewhere classified: Secondary | ICD-10-CM | POA: Insufficient documentation

## 2016-09-26 DIAGNOSIS — M545 Low back pain, unspecified: Secondary | ICD-10-CM

## 2016-09-26 NOTE — Therapy (Signed)
[facility] [address], [address] [place], [place], [address] Phone: [phone]   Fax:  [phone]  Physical Therapy Treatment  Patient Details  Name: Barbara Nixon MRN: [id number] Date of Birth: 09/01/1960 Referring Provider: Farris HasMorrow, Aaron, MD  Encounter Date: 09/26/2016      PT End of Session - 09/26/16 1314    Visit Number 6   Date for PT Re-Evaluation 10/25/16   PT Start Time 1231   PT Stop Time 1314   PT Time Calculation (min) 43 min   Activity Tolerance Patient tolerated treatment well   Behavior During Therapy [facility] for tasks assessed/performed      Past Medical History:  Diagnosis Date  . Anxiety   . Family history of heart disease   . Hypercholesteremia    takes statin  . Left bundle branch block     Past Surgical History:  Procedure Laterality Date  . KNEE ARTHROSCOPY WITH MEDIAL MENISECTOMY  05/18/2012   Procedure: KNEE ARTHROSCOPY WITH MEDIAL MENISECTOMY;  Surgeon: Javier DockerJeffrey C Beane, MD;  [facility];  Service: Orthopedics;  Laterality: Left;  Left Knee Arthroscopy with Partial Medial Menisectomy AND DEBRIDEMENT  . NM MYOCAR PERF WALL MOTION  01/22/2009   normal  . US ECHOCARDIOGRAPHY  01/22/2009   borderline concentric LVH, septal motion is c/wconduction abnormality, mild TR, mild pulmonary hypertension  . WISDOM TOOTH EXTRACTION  unknown    There were no vitals filed for this visit.      Subjective Assessment - 09/26/16 1233    Subjective I have good days and bad days.     Patient Stated Goals reudce buttock pain, sit without pain, stand/walk without pain   Currently in Pain? Yes   Pain Score 5    Pain Location Buttocks   Pain Orientation Left   Pain Descriptors / Indicators Aching   Pain Type Chronic pain   Pain Onset More than a month ago   Pain Frequency Intermittent   Aggravating Factors  sitting, standing, yardwork   Pain Relieving Factors aleve, heat, massage with  ball                         [facility] Adult PT Treatment/Exercise - 09/26/16 0001      Lumbar Exercises: Stretches   Single Knee to Chest Stretch 3 reps;20 seconds   Single Knee to Chest Stretch Limitations Knee to opposite shoulder 3x 20 sec   Piriformis Stretch 3 reps;20 seconds     Lumbar Exercises: Supine   Bridge 20 reps;5 seconds  with ball squeeze   Other Supine Lumbar Exercises piriformis release with orange spikey ball   Added slow LT hip ER 10x     Lumbar Exercises: Sidelying   Clam 20 reps  with abdominal bracing     Knee/Hip Exercises: Aerobic   Nustep Level 3 x 8 minutes  PT present to discuss progress     Manual Therapy   Manual Therapy Soft tissue mobilization;Myofascial release   Manual therapy comments orange roller and use of reflex ball to mobilize tissue                  PT Short Term Goals - 09/26/16 1237      PT SHORT TERM GOAL #2   Title report a 30% reduction in lumbar and glutal pain with standing and walking   Baseline 10%   Time 4   Period Weeks   Status On-going  PT SHORT TERM GOAL #3   Title report < or = to 4/10 gluteal pain with yardwork   Time 4   Period Weeks   Status On-going           PT Long Term Goals - 08/30/16 1234      PT LONG TERM GOAL #1   Title be independent in advanced HEP   Time 8   Period Weeks   Status New     PT LONG TERM GOAL #2   Title reduce FOTO to < or = to 36% limitation   Time 8   Period Weeks   Status New     PT LONG TERM GOAL #3   Title verbalize and demonstrate correct body mechanics techniques for spinal protection    Time 8   Period Weeks   Status New     PT LONG TERM GOAL #4   Title report a 75% reduction in lumbar and gluteal pain with standing and walking   Time 8   Period Weeks   Status New               Plan - 09/26/16 1237    Clinical Impression Statement Pt reports 10% improvement in symptoms since the start of care.  Pt with improved  sleep now.  Pt with trigger points in bil bluteals and PT focused on flexibility, strength and tissue mobilization today.  Pt reports that pain is intermittent.  Pt will continue to benefit from skilled PT for LE strength, flexibility and manual therapy.     PT Treatment/Interventions ADLs/Self Care Home Management;Cryotherapy;Electrical Stimulation;Iontophoresis 4mg /ml Dexamethasone;Functional mobility training;Ultrasound;Moist Heat;Traction;Therapeutic activities;Therapeutic exercise;Neuromuscular re-education;Patient/family education;Passive range of motion;Manual techniques;Dry needling;Taping   PT Next Visit Plan Dry needling ?, gluteal stretching and release techniques, Core strength, hip strength   Consulted and Agree with Plan of Care Patient      Patient will benefit from skilled therapeutic intervention in order to improve the following deficits and impairments:     Visit Diagnosis: Chronic left-sided low back pain without sciatica  Cramp and spasm  Stiffness of left hip, not elsewhere classified  Stiffness of right hip, not elsewhere classified     Problem List Patient Active Problem List   Diagnosis Date Noted  . Chest pain 08/05/2014  . Back pain 06/06/2014  . Hyperlipidemia 05/03/2013  . Left bundle branch block 05/03/2013  . Family history of heart disease 05/03/2013     Lorrene Reid, PT 09/26/16 1:16 PM   [facility] [address] [place], [place], [address] Phone: [phone]   Fax:  [phone]  Name: Barbara Nixon MRN: [id number] Date of Birth: 01-07-1961

## 2016-10-07 LAB — HEPATIC FUNCTION PANEL
ALT: 13 U/L (ref 6–29)
AST: 17 U/L (ref 10–35)
Albumin: 4 g/dL (ref 3.6–5.1)
Alkaline Phosphatase: 76 U/L (ref 33–130)
BILIRUBIN INDIRECT: 0.3 mg/dL (ref 0.2–1.2)
BILIRUBIN TOTAL: 0.4 mg/dL (ref 0.2–1.2)
Bilirubin, Direct: 0.1 mg/dL (ref <=0.2)
Total Protein: 6.6 g/dL (ref 6.1–8.1)

## 2016-10-07 LAB — LIPID PANEL
Cholesterol: 205 mg/dL — ABNORMAL HIGH (ref <200)
HDL: 73 mg/dL (ref >50)
LDL Cholesterol: 107 mg/dL — ABNORMAL HIGH (ref <100)
Total CHOL/HDL Ratio: 2.8 Ratio (ref <5.0)
Triglycerides: 127 mg/dL (ref <150)
VLDL: 25 mg/dL (ref <30)

## 2016-10-10 ENCOUNTER — Telehealth: Payer: Self-pay | Admitting: Cardiovascular Disease

## 2016-10-10 NOTE — Telephone Encounter (Signed)
F/u Message  Pt returning RN call about result. Please call back to discuss

## 2016-10-17 ENCOUNTER — Ambulatory Visit: Payer: 59

## 2016-10-17 DIAGNOSIS — M25652 Stiffness of left hip, not elsewhere classified: Secondary | ICD-10-CM

## 2016-10-17 DIAGNOSIS — R252 Cramp and spasm: Secondary | ICD-10-CM

## 2016-10-17 DIAGNOSIS — M545 Low back pain, unspecified: Secondary | ICD-10-CM

## 2016-10-17 DIAGNOSIS — M25651 Stiffness of right hip, not elsewhere classified: Secondary | ICD-10-CM

## 2016-10-17 DIAGNOSIS — G8929 Other chronic pain: Secondary | ICD-10-CM

## 2016-10-17 NOTE — Therapy (Signed)
[facility] [address] [place], [place], [address] Phone: [phone]   Fax:  [phone]  Physical Therapy Treatment  Patient Details  Name: Barbara Nixon MRN: [id number] Date of Birth: Aug 27, 1960 Referring Provider: Farris Has, MD  Encounter Date: 10/17/2016      PT End of Session - 10/17/16 1305    Visit Number 7   Date for PT Re-Evaluation 10/25/16   PT Start Time 1229   PT Stop Time 1311   PT Time Calculation (min) 42 min   Activity Tolerance Patient tolerated treatment well   Behavior During Therapy [facility] for tasks assessed/performed      Past Medical History:  Diagnosis Date  . Anxiety   . Family history of heart disease   . Hypercholesteremia    takes statin  . Left bundle branch block     Past Surgical History:  Procedure Laterality Date  . KNEE ARTHROSCOPY WITH MEDIAL MENISECTOMY  05/18/2012   Procedure: KNEE ARTHROSCOPY WITH MEDIAL MENISECTOMY;  Surgeon: [name], MD;  [facility];  Service: Orthopedics;  Laterality: Left;  Left Knee Arthroscopy with Partial Medial Menisectomy AND DEBRIDEMENT  . NM MYOCAR PERF WALL MOTION  01/22/2009   normal  . US ECHOCARDIOGRAPHY  01/22/2009   borderline concentric LVH, septal motion is c/wconduction abnormality, mild TR, mild pulmonary hypertension  . WISDOM TOOTH EXTRACTION  unknown    There were no vitals filed for this visit.      Subjective Assessment - 10/17/16 1234    Subjective Pt with lapse in treatment since 09/26/16.  "I haven't had much improvement in symptoms"   Currently in Pain? Yes   Pain Score 5    Pain Location Buttocks   Pain Orientation Left   Pain Descriptors / Indicators Aching   Pain Type Chronic pain   Pain Onset More than a month ago   Pain Frequency Intermittent   Aggravating Factors  sitting, standing, yardwork   Pain Relieving Factors Aleve, heat, massage with ball            [facility] PT  Assessment - 10/17/16 0001      Observation/Other Assessments   Focus on Therapeutic Outcomes (FOTO)  39% limitation                     [facility] Adult PT Treatment/Exercise - 10/17/16 0001      Lumbar Exercises: Stretches   Single Knee to Chest Stretch 3 reps;20 seconds   Single Knee to Chest Stretch Limitations Knee to opposite shoulder 3x 20 sec   Piriformis Stretch 3 reps;20 seconds     Lumbar Exercises: Supine   Bridge 20 reps;5 seconds  with ball squeeze   Other Supine Lumbar Exercises piriformis release with orange spikey ball   Added slow LT hip ER 10x     Lumbar Exercises: Sidelying   Clam 20 reps  with abdominal bracing     Knee/Hip Exercises: Aerobic   Nustep Level 3 x 8 minutes  PT present to discuss progress     Manual Therapy   Manual Therapy Soft tissue mobilization;Myofascial release   Manual therapy comments orange roller and use of reflex ball to mobilize tissue                  PT Short Term Goals - 10/17/16 1236      PT SHORT TERM GOAL #2   Title report a 30% reduction in lumbar and glutal  pain with standing and walking   Time 4   Period Weeks   Status On-going     PT SHORT TERM GOAL #3   Title report < or = to 4/10 gluteal pain with yardwork   Time 4   Period Weeks   Status On-going           PT Long Term Goals - 10/17/16 1237      PT LONG TERM GOAL #1   Title be independent in advanced HEP   Time 8   Period Weeks   Status On-going     PT LONG TERM GOAL #2   Title reduce FOTO to < or = to 36% limitation   Baseline 39% limitation   Time 8   Period Weeks   Status On-going     PT LONG TERM GOAL #3   Title verbalize and demonstrate correct body mechanics techniques for spinal protection    Status Achieved               Plan - 10/17/16 1241    Clinical Impression Statement Pt denies any significant change in symptoms since the start of care.  Pt has not been to PT in 3 weeks due to work schedule.  PT  encouraged pt to follow-up with MD.  Pt with continued intermittent Lt gluteal pain and tension.  Pt will be reassessed next session.        Patient will benefit from skilled therapeutic intervention in order to improve the following deficits and impairments:     Visit Diagnosis: Chronic left-sided low back pain without sciatica  Cramp and spasm  Stiffness of left hip, not elsewhere classified  Stiffness of right hip, not elsewhere classified     Problem List Patient Active Problem List   Diagnosis Date Noted  . Chest pain 08/05/2014  . Back pain 06/06/2014  . Hyperlipidemia 05/03/2013  . Left bundle branch block 05/03/2013  . Family history of heart disease 05/03/2013     Lorrene ReidKelly Takacs, PT 10/17/16 1:19 PM  [facility] [address] [place], [place], [address] Phone: [phone]   Fax:  [phone]  Name: Barbara Nixon MRN: [id number] Date of Birth: 07/19/1960

## 2016-10-24 ENCOUNTER — Ambulatory Visit: Payer: 59 | Attending: Family Medicine

## 2016-10-24 DIAGNOSIS — G8929 Other chronic pain: Secondary | ICD-10-CM | POA: Diagnosis present

## 2016-10-24 DIAGNOSIS — M25652 Stiffness of left hip, not elsewhere classified: Secondary | ICD-10-CM | POA: Diagnosis present

## 2016-10-24 DIAGNOSIS — M25651 Stiffness of right hip, not elsewhere classified: Secondary | ICD-10-CM | POA: Insufficient documentation

## 2016-10-24 DIAGNOSIS — R252 Cramp and spasm: Secondary | ICD-10-CM | POA: Diagnosis present

## 2016-10-24 DIAGNOSIS — M545 Low back pain: Secondary | ICD-10-CM | POA: Insufficient documentation

## 2016-10-24 NOTE — Therapy (Signed)
[facility] [address] [place], [place], [address] Phone: [phone]   Fax:  [phone]  Physical Therapy Treatment  Patient Details  Name: Barbara Nixon MRN: [id number] Date of Birth: 07/10/1960 Referring Provider: London Pepper, MD  Encounter Date: 10/24/2016      PT End of Session - 10/24/16 1256    Visit Number 8   PT Start Time 1232   PT Stop Time 8916   PT Time Calculation (min) 33 min   Activity Tolerance Patient tolerated treatment well   Behavior During Therapy [facility] for tasks assessed/performed      Past Medical History:  Diagnosis Date  . Anxiety   . Family history of heart disease   . Hypercholesteremia    takes statin  . Left bundle branch block     Past Surgical History:  Procedure Laterality Date  . KNEE ARTHROSCOPY WITH MEDIAL MENISECTOMY  05/18/2012   Procedure: KNEE ARTHROSCOPY WITH MEDIAL MENISECTOMY;  Surgeon: Johnn Hai, MD;  Location: [place];  Service: Orthopedics;  Laterality: Left;  Left Knee Arthroscopy with Partial Medial Menisectomy AND DEBRIDEMENT  . NM MYOCAR PERF WALL MOTION  01/22/2009   normal  . US ECHOCARDIOGRAPHY  01/22/2009   borderline concentric LVH, septal motion is c/wconduction abnormality, mild TR, mild pulmonary hypertension  . WISDOM TOOTH EXTRACTION  unknown    There were no vitals filed for this visit.      Subjective Assessment - 10/24/16 1234    Subjective I used a hard ball for trigger point release in Lt gluteals and it made my buttock so sore.  Had a fall because leg gave out.     Patient Stated Goals reudce buttock pain, sit without pain, stand/walk without pain   Currently in Pain? Yes   Pain Score 3    Pain Location Buttocks   Pain Orientation Left   Pain Descriptors / Indicators Aching   Pain Type Chronic pain   Pain Onset More than a month ago   Pain Frequency Intermittent   Aggravating Factors  sitting, standing,  yardwork   Pain Relieving Factors Aleve, heat, stretching            [facility] PT Assessment - 10/24/16 0001      Assessment   Medical Diagnosis Lt low back pain, Lt buttock and thigh pain     [place] residence   Living Arrangements Spouse/significant other     Observation/Other Assessments   Focus on Therapeutic Outcomes (FOTO)  39% limitation                     [facility] Adult PT Treatment/Exercise - 10/24/16 0001      Lumbar Exercises: Stretches   Single Knee to Chest Stretch 3 reps;20 seconds   Single Knee to Chest Stretch Limitations Knee to opposite shoulder 3x 20 sec   Piriformis Stretch 3 reps;20 seconds     Lumbar Exercises: Supine   Bridge 20 reps;5 seconds  with ball squeeze   Other Supine Lumbar Exercises piriformis release with orange spikey ball   Added slow LT hip ER 10x     Lumbar Exercises: Sidelying   Clam 20 reps  with abdominal bracing   Clam Limitations yellow theraband     Knee/Hip Exercises: Aerobic   Nustep Level 3 x 8 minutes  PT present to discuss progress  PT Short Term Goals - 10/17/16 1236      PT SHORT TERM GOAL #2   Title report a 30% reduction in lumbar and glutal pain with standing and walking   Time 4   Period Weeks   Status On-going     PT SHORT TERM GOAL #3   Title report < or = to 4/10 gluteal pain with yardwork   Time 4   Period Weeks   Status On-going           PT Long Term Goals - 10/24/16 1238      PT LONG TERM GOAL #1   Title be independent in advanced HEP   Status Achieved     PT LONG TERM GOAL #2   Title reduce FOTO to < or = to 36% limitation   Baseline 39% limitation   Status Partially Met     PT LONG TERM GOAL #3   Title verbalize and demonstrate correct body mechanics techniques for spinal protection    Status Achieved     PT LONG TERM GOAL #4   Title report a 75% reduction in lumbar and gluteal pain with standing and  walking   Baseline 20% limitation   Status Partially Met               Plan - 10/24/16 1245    Clinical Impression Statement Pt presents 20% overall improvement in symptoms since the start of care.  Pt has HEP in place for strength and flexibility and has had dry needling and manual therapy for tissue mobility of the Lt buttock.  Pt will see MD in July to discuss continued symptoms.  Pt will D/C today to HEP.   PT Next Visit Plan D/C PT to HEP   Consulted and Agree with Plan of Care Patient      Patient will benefit from skilled therapeutic intervention in order to improve the following deficits and impairments:     Visit Diagnosis: Chronic left-sided low back pain without sciatica  Cramp and spasm  Stiffness of left hip, not elsewhere classified  Stiffness of right hip, not elsewhere classified     Problem List Patient Active Problem List   Diagnosis Date Noted  . Chest pain 08/05/2014  . Back pain 06/06/2014  . Hyperlipidemia 05/03/2013  . Left bundle branch block 05/03/2013  . Family history of heart disease 05/03/2013   PHYSICAL THERAPY DISCHARGE SUMMARY  Visits from Start of Care: 8  Current functional level related to goals / functional outcomes: See above for current status. Pt reports 20% overall improvement.  Varying levels of Lt buttock pain up to 7-10/10 and average of 3-4/10.     Remaining deficits: See above for current status.     Education / Equipment: HEP, Economist Plan: Patient agrees to discharge.  Patient goals were partially met. Patient is being discharged due to being pleased with the current functional level.  ?????       Sigurd Sos, PT 10/24/16 12:59 PM  [facility] [address], [place], [place], [address] Phone: [phone]   Fax:  [phone]  Name: Pansey Pinheiro MRN: [id number] Date of Birth: 10/16/1960

## 2016-10-27 NOTE — Telephone Encounter (Signed)
lmtcb---lipid profile results

## 2016-11-14 NOTE — Progress Notes (Signed)
[name] [address] [place], [place] [address] Phone: [phone] Subjective:    CC: Back and hip pain  BJY:[id number]  Barbara Nixon is a 56 y.o. female coming in with complaint of Left hip and back pain. Patient has had this for some time. Has been seen a physical therapist for some time. Patient has mild radicular pain down the left leg. Continues to have a dull, throbbing aching pain mostly on the lateral aspect of the hip. Sometimes radiates to the posterior aspect. Some mild back pain seems to be associated with it. States significant tightness after sitting for some time. Seems to get a little bit better with activity. Rates the severity of pain a 6 out of 10. Onset-     Past Medical History:  Diagnosis Date  . Anxiety   . Family history of heart disease   . Hypercholesteremia    takes statin  . Left bundle branch block    Past Surgical History:  Procedure Laterality Date  . KNEE ARTHROSCOPY WITH MEDIAL MENISECTOMY  05/18/2012   Procedure: KNEE ARTHROSCOPY WITH MEDIAL MENISECTOMY;  Surgeon: [name], MD;  Location: [facility];  Service: Orthopedics;  Laterality: Left;  Left Knee Arthroscopy with Partial Medial Menisectomy AND DEBRIDEMENT  . NM MYOCAR PERF WALL MOTION  01/22/2009   normal  . US ECHOCARDIOGRAPHY  01/22/2009   borderline concentric LVH, septal motion is c/wconduction abnormality, mild TR, mild pulmonary hypertension  . WISDOM TOOTH EXTRACTION  unknown   Social History   Social History  . Marital status: Married    Spouse name: N/A  . Number of children: N/A  . Years of education: N/A   Social History Main Topics  . Smoking status: Never Smoker  . Smokeless tobacco: Never Used  . Alcohol use 0.0 oz/week     Comment: social  . Drug use: No  . Sexual activity: Not on file   Other Topics Concern  . Not on file   Social History Narrative  . No narrative on file   Allergies  Allergen Reactions  .  Penicillins     REACTION: as child, unspecified  . Statins Other (See Comments)    Myalgias    Family History  Problem Relation Age of Onset  . Heart attack Mother   . Hypertension Mother   . Hyperlipidemia Mother   . Hypertension Father   . Hyperlipidemia Brother      Past medical history, social, surgical and family history all reviewed in electronic medical record.  No pertanent information unless stated regarding to the chief complaint.   Review of Systems:Review of systems updated and as accurate as of 11/14/16  No headache, visual changes, nausea, vomiting, diarrhea, constipation, dizziness, abdominal pain, skin rash, fevers, chills, night sweats, weight loss, swollen lymph nodes, body aches, joint swelling,  chest pain, shortness of breath, mood changes. Positive muscle aches  Objective  There were no vitals taken for this visit. Systems examined below as of 11/14/16   General: No apparent distress alert and oriented x3 mood and affect normal, dressed appropriately.  HEENT: Pupils equal, extraocular movements intact  Respiratory: Patient's speak in full sentences and does not appear short of breath  Cardiovascular: No lower extremity edema, non tender, no erythema  Skin: Warm dry intact with no signs of infection or rash on extremities or on axial skeleton.  Abdomen: Soft nontender  Neuro: Cranial nerves II through XII are intact, neurovascularly intact in all  extremities with 2+ DTRs and 2+ pulses.  Lymph: No lymphadenopathy of posterior or anterior cervical chain or axillae bilaterally.  Gait normal with good balance and coordination.  MSK:  Non tender with full range of motion and good stability and symmetric strength and tone of shoulders, elbows, wrist, hip, knee and ankles bilaterally.  Back Exam:  Inspection: Unremarkable  Motion: Flexion 35 deg, Extension 25 deg, Side Bending to 25 deg bilaterally,  Rotation to 25 deg bilaterally  SLR laying: Negative  XSLR  laying: Negative  Palpable tenderness:Severe tenderness to palpation over the lateral aspect of the hip over the greater enteric area.Marland Kitchen. FABER: negative. Sensory change: Gross sensation intact to all lumbar and sacral dermatomes.  Reflexes: 2+ at both patellar tendons, 2+ at achilles tendons, Babinski's downgoing.  Strength at foot  4 out of 5 strength on the left side compared to 5 out of 5 on the right sign.    Procedure: Real-time Ultrasound Guided Injection of left  greater trochanteric bursitis secondary to patient's body habitus Device: GE Logiq Q7  Ultrasound guided injection is preferred based studies that show increased duration, increased effect, greater accuracy, decreased procedural pain, increased response rate, and decreased cost with ultrasound guided versus blind injection.  Verbal informed consent obtained.  Time-out conducted.  Noted no overlying erythema, induration, or other signs of local infection.  Skin prepped in a sterile fashion.  Local anesthesia: Topical Ethyl chloride.  With sterile technique and under real time ultrasound guidance:  Greater trochanteric area was visualized and patient's bursa was noted. A 22-gauge 3 inch needle was inserted and 4 cc of 0.5% Marcaine and 1 cc of Kenalog 40 mg/dL was injected. Pictures taken Completed without difficulty  Pain immediately resolved suggesting accurate placement of the medication.  Advised to call if fevers/chills, erythema, induration, drainage, or persistent bleeding.  Images permanently stored and available for review in the ultrasound unit.  Impression: Technically successful ultrasound guided injection.   Impression and Recommendations:     This case required medical decision making of moderate complexity.      Note: This dictation was prepared with Dragon dictation along with smaller phrase technology. Any transcriptional errors that result from this process are unintentional.

## 2016-11-15 ENCOUNTER — Encounter: Payer: Self-pay | Admitting: Family Medicine

## 2016-11-15 ENCOUNTER — Ambulatory Visit (INDEPENDENT_AMBULATORY_CARE_PROVIDER_SITE_OTHER): Payer: 59 | Admitting: Family Medicine

## 2016-11-15 ENCOUNTER — Ambulatory Visit: Payer: Self-pay

## 2016-11-15 VITALS — BP 122/72 | HR 64 | Ht 61.0 in | Wt 157.0 lb

## 2016-11-15 DIAGNOSIS — M25552 Pain in left hip: Secondary | ICD-10-CM

## 2016-11-15 DIAGNOSIS — M7062 Trochanteric bursitis, left hip: Secondary | ICD-10-CM | POA: Diagnosis not present

## 2016-11-15 MED ORDER — GABAPENTIN 100 MG PO CAPS: 200.0000 mg | ORAL_CAPSULE | Freq: Every day | ORAL | 3 refills | Status: DC

## 2016-11-15 MED ORDER — DICLOFENAC SODIUM 2 % TD SOLN: 2.0000 "application " | Freq: Two times a day (BID) | TRANSDERMAL | 3 refills | Status: DC

## 2016-11-15 NOTE — Assessment & Plan Note (Signed)
Patient given injection today. Discussed icing regimen. Topical anti-inflammatory's prescribed. Differential includes a lumbar radiculopathy and gabapentin given. We discussed home exercises and patient given she. We discussed possible imaging including back and hip x-rays of this continues. We also discussed the possibility of osteopathic manipulation. Follow-up again in 4 weeks for further evaluation.

## 2016-11-15 NOTE — Patient Instructions (Signed)
Good to see you.  Ice 20 minutes 2 times daily. Usually after activity and before bed. Exercises 3 times a week.  pennsaid pinkie amount topically 2 times daily as needed.  Gabapentin 200mg  at night Avoid being barefoot.  Over the counter Turmeric 500mg  twice daily  Tart cherry extract any dose at night Vitamin D 2000 IU daily  See me again in 4 weeks and we may start manipulation

## 2016-12-20 ENCOUNTER — Ambulatory Visit (INDEPENDENT_AMBULATORY_CARE_PROVIDER_SITE_OTHER)
Admission: RE | Admit: 2016-12-20 | Discharge: 2016-12-20 | Disposition: A | Discharge status: Home / Self Care | Payer: 59 | Source: Ambulatory Visit | Attending: Family Medicine | Admitting: Family Medicine

## 2016-12-20 ENCOUNTER — Ambulatory Visit (INDEPENDENT_AMBULATORY_CARE_PROVIDER_SITE_OTHER): Payer: 59 | Admitting: Family Medicine

## 2016-12-20 ENCOUNTER — Encounter: Payer: Self-pay | Admitting: Family Medicine

## 2016-12-20 VITALS — BP 140/90 | HR 80 | Ht 61.0 in | Wt 155.0 lb

## 2016-12-20 DIAGNOSIS — M545 Low back pain, unspecified: Secondary | ICD-10-CM

## 2016-12-20 DIAGNOSIS — G8929 Other chronic pain: Secondary | ICD-10-CM | POA: Diagnosis not present

## 2016-12-20 DIAGNOSIS — M999 Biomechanical lesion, unspecified: Secondary | ICD-10-CM

## 2016-12-20 NOTE — Assessment & Plan Note (Signed)
Low back pain that seems to be multifactorial. Likely secondary to more muscle imbalances. Gabapentin as needed. Responded well to manipulation. X-rays ordered today to further evaluate for any type of bone of normality that to be contribute in. Follow-up again in 4-8 weeks.

## 2016-12-20 NOTE — Patient Instructions (Addendum)
Good to see you  Barbara Nixon is your friend.  Stay active.  I think manipulation will be good! Keep working on the muscle imbalances  Xray downstairs today  OK to do gabapentin as needed See me again in 4-6 weeks.

## 2016-12-20 NOTE — Progress Notes (Signed)
[name] [address] [place], [place] [address] Phone: [phone] Subjective:    CC: Back and hip pain  [id number]  Barbara Nixon is a 56 y.o. female coming in with complaint of Left hip and back pain. Patient was seen previously given injection in the greater trochanter area. Patient tolerated the procedure well. States that 80% of the pain is better. Still having some mild back pain with very mild radiation down the leg. Very minimal. Patient is wanting to prevent this from coming back and wants to be more active.    Past Medical History:  Diagnosis Date  . Anxiety   . Family history of heart disease   . Hypercholesteremia    takes statin  . Left bundle branch block    Past Surgical History:  Procedure Laterality Date  . KNEE ARTHROSCOPY WITH MEDIAL MENISECTOMY  05/18/2012   Procedure: KNEE ARTHROSCOPY WITH MEDIAL MENISECTOMY;  Surgeon: [name], MD;  Location: [facility];  Service: Orthopedics;  Laterality: Left;  Left Knee Arthroscopy with Partial Medial Menisectomy AND DEBRIDEMENT  . NM MYOCAR PERF WALL MOTION  01/22/2009   normal  . US ECHOCARDIOGRAPHY  01/22/2009   borderline concentric LVH, septal motion is c/wconduction abnormality, mild TR, mild pulmonary hypertension  . WISDOM TOOTH EXTRACTION  unknown   Social History   Social History  . Marital status: Married    Spouse name: N/A  . Number of children: N/A  . Years of education: N/A   Social History Main Topics  . Smoking status: Never Smoker  . Smokeless tobacco: Never Used  . Alcohol use 0.0 oz/week     Comment: social  . Drug use: No  . Sexual activity: Not Asked   Other Topics Concern  . None   Social History Narrative  . None   Allergies  Allergen Reactions  . Penicillins     REACTION: as child, unspecified  . Statins Other (See Comments)    Myalgias    Family History  Problem Relation Age of Onset  . Heart attack Mother   .  Hypertension Mother   . Hyperlipidemia Mother   . Hypertension Father   . Hyperlipidemia Brother      Past medical history, social, surgical and family history all reviewed in electronic medical record.  No pertanent information unless stated regarding to the chief complaint.   Review of Systems: No headache, visual changes, nausea, vomiting, diarrhea, constipation, dizziness, abdominal pain, skin rash, fevers, chills, night sweats, weight loss, swollen lymph nodes, body aches, joint swelling, chest pain, shortness of breath, mood changes. Positive muscle aches  Objective  Blood pressure 140/90, pulse 80, height 5\' 1"  (1.549 m), weight 155 lb (70.3 kg).   Systems examined below as of 12/20/16 General: NAD A&O x3 mood, affect normal  HEENT: Pupils equal, extraocular movements intact no nystagmus Respiratory: not short of breath at rest or with speaking Cardiovascular: No lower extremity edema, non tender Skin: Warm dry intact with no signs of infection or rash on extremities or on axial skeleton. Abdomen: Soft nontender, no masses Neuro: Cranial nerves  intact, neurovascularly intact in all extremities with 2+ DTRs and 2+ pulses. Lymph: No lymphadenopathy appreciated today  Gait normal with good balance and coordination.  MSK: Non tender with full range of motion and good stability and symmetric strength and tone of shoulders, elbows, wrist,  knee hips and ankles bilaterally.    Back Exam:  Inspection: Mild loss in lordosis  Motion: Flexion 45 deg, Extension 25 deg, Side Bending to 45 deg bilaterally,  Rotation to 45 deg bilaterally  SLR laying: Negative  XSLR laying: Negative  Palpable tenderness: Mild pain over the paraspinal musculature on the right side. FABER: negative. Sensory change: Gross sensation intact to all lumbar and sacral dermatomes.  Reflexes: 2+ at both patellar tendons, 2+ at achilles tendons, Babinski's downgoing.  Strength at foot  Plantar-flexion: 5/5  Dorsi-flexion: 5/5 Eversion: 5/5 Inversion: 5/5  Leg strength  Quad: 5/5 Hamstring: 5/5 Hip flexor: 5/5 Hip abductors: 5/5  Gait unremarkable.  Osteopathic findings C2 flexed rotated and side bent right T3 extended rotated and side bent right inhaled third rib T9 extended rotated and side bent left L2 flexed rotated and side bent right Sacrum right on right .   Impression and Recommendations:     This case required medical decision making of moderate complexity.      Note: This dictation was prepared with Dragon dictation along with smaller phrase technology. Any transcriptional errors that result from this process are unintentional.

## 2016-12-20 NOTE — Assessment & Plan Note (Signed)
Decision today to treat with OMT was based on Physical Exam  After verbal consent patient was treated with HVLA, ME, FPR techniques in cervical, thoracic, lumbar and sacral areas  Patient tolerated the procedure well with improvement in symptoms  Patient given exercises, stretches and lifestyle modifications  See medications in patient instructions if given  Patient will follow up in 4-8 weeks 

## 2017-01-17 ENCOUNTER — Encounter: Payer: Self-pay | Admitting: Family Medicine

## 2017-01-17 ENCOUNTER — Ambulatory Visit (INDEPENDENT_AMBULATORY_CARE_PROVIDER_SITE_OTHER): Payer: 59 | Admitting: Family Medicine

## 2017-01-17 VITALS — BP 126/84 | HR 67 | Ht 61.0 in | Wt 157.0 lb

## 2017-01-17 DIAGNOSIS — M5416 Radiculopathy, lumbar region: Secondary | ICD-10-CM

## 2017-01-17 DIAGNOSIS — M999 Biomechanical lesion, unspecified: Secondary | ICD-10-CM

## 2017-01-17 MED ORDER — VENLAFAXINE HCL ER 37.5 MG PO CP24: 37.5000 mg | ORAL_CAPSULE | Freq: Every day | ORAL | 1 refills | Status: DC

## 2017-01-17 NOTE — Assessment & Plan Note (Signed)
Patient does have more of a lumbar radiculopathy. I do believe that with patient's x-rays showing [place] little bit of a spondylolisthesis this could be contributing to some of the radicular symptoms patient is having. Unable to tolerate the gabapentin on a regular basis. Started on Effexor with low dose. Discussed different treatment options. We discussed icing regimen. Patient will try to increase activity as tolerated over the course the next several weeks. Follow-up again in 4 weeks for further evaluation.

## 2017-01-17 NOTE — Progress Notes (Signed)
[name] [address] [place], [place] [address] Phone: [phone] Subjective:       CC: Hip and back pain  BJY:[id number]  Barbara Nixon is a 56 y.o. female coming in for follow up for back and hip pain. Most of the pain is on her left side and has seemed to have gotten worse for some reason. She was at the beach this past weekend and did a lot of walking which may have exacerbated the pain that she was already having. Patient states and has been doing the exercises that are minimally. Still though having pain. Still has has many bad days his good days. Patient states that still radiates down the leg. Seems to be worse at night. Denies any new symptoms and possibly some mild worsening of previous symptoms.     Past Medical History:  Diagnosis Date  . Anxiety   . Family history of heart disease   . Hypercholesteremia    takes statin  . Left bundle branch block    Past Surgical History:  Procedure Laterality Date  . KNEE ARTHROSCOPY WITH MEDIAL MENISECTOMY  05/18/2012   Procedure: KNEE ARTHROSCOPY WITH MEDIAL MENISECTOMY;  Surgeon: [name], MD;  Location: [facility];  Service: Orthopedics;  Laterality: Left;  Left Knee Arthroscopy with Partial Medial Menisectomy AND DEBRIDEMENT  . NM MYOCAR PERF WALL MOTION  01/22/2009   normal  . US ECHOCARDIOGRAPHY  01/22/2009   borderline concentric LVH, septal motion is c/wconduction abnormality, mild TR, mild pulmonary hypertension  . WISDOM TOOTH EXTRACTION  unknown   Social History   Social History  . Marital status: Married    Spouse name: N/A  . Number of children: N/A  . Years of education: N/A   Social History Main Topics  . Smoking status: Never Smoker  . Smokeless tobacco: Never Used  . Alcohol use 0.0 oz/week     Comment: social  . Drug use: No  . Sexual activity: Not Asked   Other Topics Concern  . None   Social History Narrative  . None   Allergies  Allergen  Reactions  . Penicillins     REACTION: as child, unspecified  . Statins Other (See Comments)    Myalgias    Family History  Problem Relation Age of Onset  . Heart attack Mother   . Hypertension Mother   . Hyperlipidemia Mother   . Hypertension Father   . Hyperlipidemia Brother      Past medical history, social, surgical and family history all reviewed in electronic medical record.  No pertanent information unless stated regarding to the chief complaint.   Review of Systems:Review of systems updated and as accurate as of 01/17/17  No headache, visual changes, nausea, vomiting, diarrhea, constipation, dizziness, abdominal pain, skin rash, fevers, chills, night sweats, weight loss, swollen lymph nodes, body aches, joint swelling, muscle aches, chest pain, shortness of breath, mood changes.   Objective  Blood pressure 126/84, pulse 67, height  (1.549 m), weight 157 lb (71.2 kg), SpO2 97 %. Systems examined below as of 01/17/17   General: No apparent distress alert and oriented x3 mood and affect normal, dressed appropriately.  HEENT: Pupils equal, extraocular movements intact  Respiratory: Patient's speak in full sentences and does not appear short of breath  Cardiovascular: No lower extremity edema, non tender, no erythema  Skin: Warm dry intact with no signs of infection or rash on extremities or on axial skeleton.  Abdomen:  Soft nontender  Neuro: Cranial nerves II through XII are intact, neurovascularly intact in all extremities with 2+ DTRs and 2+ pulses.  Lymph: No lymphadenopathy of posterior or anterior cervical chain or axillae bilaterally.  Gait normal with good balance and coordination.  MSK:  Non tender with full range of motion and good stability and symmetric strength and tone of shoulders, elbows, wrist, hip, knee and ankles bilaterally.  Back Exam:  Inspection: Unremarkable  Motion: Flexion 45 deg, Extension 25 deg, Side Bending to 45 deg bilaterally,  Rotation  to 45 deg bilaterally  SLR laying: Mild positive left-sided XSLR laying: Negative  Palpable tenderness: tenderness of the paraspinal musculature lumbar spine L4-L5 on the left side. Mild positive Pearlean Brownie on the left. Till some discomfort over the left lateral greater enteric area Sensory change: Gross sensation intact to all lumbar and sacral dermatomes.  Reflexes: 2+ at both patellar tendons, 2+ at achilles tendons, Babinski's downgoing.  Strength at foot  Plantar-flexion: 5/5 Dorsi-flexion: 5/5 Eversion: 5/5 Inversion: 5/5  Leg strength  Quad: 5/5 Hamstring: 5/5 Hip flexor: 5/5 Hip abductors: 4/5 nd symmetric Gait unremarkable.  Osteopathic findings C6 flexed rotated and side bent left T3 extended rotated and side bent right inhaled third rib T6 extended rotated and side bent right L4 flexed rotated and side bent left Sacrum right on right      Impression and Recommendations:     This case required medical decision making of moderate complexity.      Note: This dictation was prepared with Dragon dictation along with smaller phrase technology. Any transcriptional errors that result from this process are unintentional.

## 2017-01-17 NOTE — Assessment & Plan Note (Signed)
Decision today to treat with OMT was based on Physical Exam  After verbal consent patient was treated with HVLA, ME, FPR techniques in  thoracic, lumbar and sacral areas  Patient tolerated the procedure well with improvement in symptoms  Patient given exercises, stretches and lifestyle modifications  See medications in patient instructions if given  Patient will follow up in 4 weeks 

## 2017-01-17 NOTE — Patient Instructions (Signed)
Good to see you  Try the exercises 3 times a week.  Ice is your friend Effexor daily but take in the morning OK to use the gabapentin still at night I think you are making progress See me again in 4 weeks and we will discuss MRI or going up on medicine if making progress.

## 2017-02-14 ENCOUNTER — Encounter: Payer: Self-pay | Admitting: Family Medicine

## 2017-02-14 ENCOUNTER — Ambulatory Visit (INDEPENDENT_AMBULATORY_CARE_PROVIDER_SITE_OTHER): Payer: 59 | Admitting: Family Medicine

## 2017-02-14 VITALS — BP 138/98 | HR 72 | Ht 61.0 in | Wt 158.0 lb

## 2017-02-14 DIAGNOSIS — M999 Biomechanical lesion, unspecified: Secondary | ICD-10-CM

## 2017-02-14 DIAGNOSIS — M5416 Radiculopathy, lumbar region: Secondary | ICD-10-CM | POA: Diagnosis not present

## 2017-02-14 NOTE — Assessment & Plan Note (Signed)
Decision today to treat with OMT was based on Physical Exam  After verbal consent patient was treated with HVLA, ME, FPR techniques in cervical, thoracic, lumbar and sacral areas  Patient tolerated the procedure well with improvement in symptoms  Patient given exercises, stretches and lifestyle modifications  See medications in patient instructions if given  Patient will follow up in 4 weeks 

## 2017-02-14 NOTE — Assessment & Plan Note (Signed)
Patient is having worsening symptoms at this time. Is on the Effexor as well as gabapentin with no significant benefit. Attempted osteopathic manipulation multiple times including today. I do not think that this is likely going to help. Patient's x-ray showed mild osteoarthritic changes and I feel that possible herniated disc with nerve impingement as happening. Patient will have an MRI at this time. Could be a candidate for epidural steroid injections. Depending on findings we'll discuss further management thereafter. No change in medications at this time will call if any prednisone is necessary.

## 2017-02-14 NOTE — Progress Notes (Signed)
Tawana ScaleZach Tatsuo Musial D.O. [facility] [address] [place], [place] [address] Phone: [phone] Subjective:     CC: Low back follow-up  QIO:[id number]:Subjective  Barbara CarneyDiana Otoole is a 56 y.o. female coming in for follow up for hip and back pain. She has been having good and bad days since last visit. While sleeping she has been having pain with lying on her right side. She has been using gabapentin and using pennsaid.   Patient also states that she has a lot been falling a lot recently.Patient feels like her left leg seems to give out on her from time to time. Patient states that she is on twice since her last visit. We have attempted osteopathic manipulation for the back that gives her some relief or short-term. Patient was given an injection in the greater trochanteric area 3 months ago but the pain came back 1 month. States that the back seems to be more concerning problem.      Past Medical History:  Diagnosis Date  . Anxiety   . Family history of heart disease   . Hypercholesteremia    takes statin  . Left bundle branch block    Past Surgical History:  Procedure Laterality Date  . KNEE ARTHROSCOPY WITH MEDIAL MENISECTOMY  05/18/2012   Procedure: KNEE ARTHROSCOPY WITH MEDIAL MENISECTOMY;  Surgeon: Javier DockerJeffrey C Beane, MD;  [facility];  Service: Orthopedics;  Laterality: Left;  Left Knee Arthroscopy with Partial Medial Menisectomy AND DEBRIDEMENT  . NM MYOCAR PERF WALL MOTION  01/22/2009   normal  . US ECHOCARDIOGRAPHY  01/22/2009   borderline concentric LVH, septal motion is c/wconduction abnormality, mild TR, mild pulmonary hypertension  . WISDOM TOOTH EXTRACTION  unknown   Social History   Social History  . Marital status: Married    Spouse name: N/A  . Number of children: N/A  . Years of education: N/A   Social History Main Topics  . Smoking status: Never Smoker  . Smokeless tobacco: Never Used  . Alcohol use 0.0 oz/week     Comment: social  . Drug use:  No  . Sexual activity: Not Asked   Other Topics Concern  . None   Social History Narrative  . None   Allergies  Allergen Reactions  . Penicillins     REACTION: as child, unspecified  . Statins Other (See Comments)    Myalgias    Family History  Problem Relation Age of Onset  . Heart attack Mother   . Hypertension Mother   . Hyperlipidemia Mother   . Hypertension Father   . Hyperlipidemia Brother      Past medical history, social, surgical and family history all reviewed in electronic medical record.  No pertanent information unless stated regarding to the chief complaint.   Review of Systems:Review of systems updated and as accurate as of 02/14/17  No headache, visual changes, nausea, vomiting, diarrhea, constipation, dizziness, abdominal pain, skin rash, fevers, chills, night sweats, weight loss, swollen lymph nodes, body aches, joint swelling,chest pain, shortness of breath, mood changes. Positive muscle aches  Objective  Blood pressure (!) 138/98, pulse 72, height 5\' 1"  (1.549 m), weight 158 lb (71.7 kg), SpO2 98 %. Systems examined below as of 02/14/17   General: No apparent distress alert and oriented x3 mood and affect normal, dressed appropriately.  HEENT: Pupils equal, extraocular movements intact  Respiratory: Patient's speak in full sentences and does not appear short of breath  Cardiovascular: No lower extremity edema, non tender,  no erythema  Skin: Warm dry intact with no signs of infection or rash on extremities or on axial skeleton.  Abdomen: Soft nontender  Neuro: Cranial nerves II through XII are intact, neurovascularly intact in all extremities with 2+ DTRs and 2+ pulses.  Lymph: No lymphadenopathy of posterior or anterior cervical chain or axillae bilaterally.  Gait normal with good balance and coordination.  MSK:  Non tender with full range of motion and good stability and symmetric strength and tone of shoulders, elbows, wrist, hip, knee and ankles  bilaterally.  Back Exam:  Inspection: Mild loss in lordosis Motion: Flexion 40 deg, Extension 25 deg, Side Bending to 45 deg bilaterally,  Rotation to 35 deg bilaterally  SLR laying: Positive left. Radicular symptoms of the L5 correspondence XSLR laying: Negative  Palpable tenderness: Tender to palpation and appears palmar musculature lumbar spine.Marland Kitchen FABER: Tightness bilaterally. Sensory change: Gross sensation intact to all lumbar and sacral dermatomes.  Very mild weakness with plantarflexion but patient does have deep tendon reflexes intact Strength at foot  Plantar-flexion: 5/5 Dorsi-flexion: 5/5 Eversion: 5/5 Inversion: 5/5  Leg strength  Quad: 5/5 Hamstring: 5/5 Hip flexor: 5/5 Hip abductors: 4/5  Gait unremarkable.  Osteopathic findings C2 flexed rotated and side bent right T5 extended rotated and side bent right inhaled rib T9 extended rotated and side bent left L3 flexed rotated and side bent right L5 flexed rotated and side bent left Sacrum right on right     Impression and Recommendations:     This case required medical decision making of moderate complexity.      Note: This dictation was prepared with Dragon dictation along with smaller phrase technology. Any transcriptional errors that result from this process are unintentional.

## 2017-02-14 NOTE — Patient Instructions (Signed)
Good to see you  Ice 20 minutes daily  We will get MRI due to the change of the pain  I will write you when I get the MRI and we will discuss next step .

## 2017-02-24 ENCOUNTER — Ambulatory Visit
Admission: RE | Admit: 2017-02-24 | Discharge: 2017-02-24 | Disposition: A | Discharge status: Home / Self Care | Payer: 59 | Source: Ambulatory Visit | Attending: Family Medicine | Admitting: Family Medicine

## 2017-02-24 DIAGNOSIS — M5416 Radiculopathy, lumbar region: Secondary | ICD-10-CM

## 2017-03-02 ENCOUNTER — Other Ambulatory Visit: Payer: Self-pay | Admitting: *Deleted

## 2017-03-02 DIAGNOSIS — M5416 Radiculopathy, lumbar region: Secondary | ICD-10-CM

## 2017-03-16 ENCOUNTER — Other Ambulatory Visit: Payer: Self-pay | Admitting: Family Medicine

## 2017-03-27 ENCOUNTER — Other Ambulatory Visit: Payer: 59

## 2017-03-28 NOTE — Progress Notes (Deleted)
Barbara Nixon D.O. [facility] [address] [place], [place] [address] Phone: [phone] Subjective:    I'm seeing this patient by the request  of:    CC:   VFI:[id number]:Subjective  Barbara CarneyDiana Nixon is a 56 y.o. female coming in with complaint of ***  Onset-  Location Duration-  Character- Aggravating factors- Reliving factors-  Therapies tried-  Severity-   Patient did have an MRI of the lumbar spine done.  This was February 24, 2017.  Found to have mild degenerative disc disease at L4-L5 and moderate facet arthropathy at L4 through S1 but no definitive coronal impingement.  This was independently visualized by me.  Past Medical History:  Diagnosis Date  . Anxiety   . Family history of heart disease   . Hypercholesteremia    takes statin  . Left bundle branch block    Past Surgical History:  Procedure Laterality Date  . KNEE ARTHROSCOPY WITH MEDIAL MENISECTOMY  05/18/2012   Procedure: KNEE ARTHROSCOPY WITH MEDIAL MENISECTOMY;  Surgeon: Javier DockerJeffrey C Beane, MD;  [facility] [place];  Service: Orthopedics;  Laterality: Left;  Left Knee Arthroscopy with Partial Medial Menisectomy AND DEBRIDEMENT  . NM MYOCAR PERF WALL MOTION  01/22/2009   normal  . US ECHOCARDIOGRAPHY  01/22/2009   borderline concentric LVH, septal motion is c/wconduction abnormality, mild TR, mild pulmonary hypertension  . WISDOM TOOTH EXTRACTION  unknown   Social History   Socioeconomic History  . Marital status: Married    Spouse name: Not on file  . Number of children: Not on file  . Years of education: Not on file  . Highest education level: Not on file  Social Needs  . Financial resource strain: Not on file  . Food insecurity - worry: Not on file  . Food insecurity - inability: Not on file  . Transportation needs - medical: Not on file  . Transportation needs - non-medical: Not on file  Occupational History  . Not on file  Tobacco Use  . Smoking status: Never Smoker  . Smokeless  tobacco: Never Used  Substance and Sexual Activity  . Alcohol use: Yes    Alcohol/week: 0.0 oz    Comment: social  . Drug use: No  . Sexual activity: Not on file  Other Topics Concern  . Not on file  Social History Narrative  . Not on file   Allergies  Allergen Reactions  . Penicillins     REACTION: as child, unspecified  . Statins Other (See Comments)    Myalgias    Family History  Problem Relation Age of Onset  . Heart attack Mother   . Hypertension Mother   . Hyperlipidemia Mother   . Hypertension Father   . Hyperlipidemia Brother      Past medical history, social, surgical and family history all reviewed in electronic medical record.  No pertanent information unless stated regarding to the chief complaint.   Review of Systems:Review of systems updated and as accurate as of 03/28/17  No headache, visual changes, nausea, vomiting, diarrhea, constipation, dizziness, abdominal pain, skin rash, fevers, chills, night sweats, weight loss, swollen lymph nodes, body aches, joint swelling, muscle aches, chest pain, shortness of breath, mood changes.   Objective  There were no vitals taken for this visit. Systems examined below as of 03/28/17   General: No apparent distress alert and oriented x3 mood and affect normal, dressed appropriately.  HEENT: Pupils equal, extraocular movements intact  Respiratory: Patient's speak in full sentences  and does not appear short of breath  Cardiovascular: No lower extremity edema, non tender, no erythema  Skin: Warm dry intact with no signs of infection or rash on extremities or on axial skeleton.  Abdomen: Soft nontender  Neuro: Cranial nerves II through XII are intact, neurovascularly intact in all extremities with 2+ DTRs and 2+ pulses.  Lymph: No lymphadenopathy of posterior or anterior cervical chain or axillae bilaterally.  Gait normal with good balance and coordination.  MSK:  Non tender with full range of motion and good stability  and symmetric strength and tone of shoulders, elbows, wrist, hip, knee and ankles bilaterally.     Impression and Recommendations:     This case required medical decision making of moderate complexity.      Note: This dictation was prepared with Dragon dictation along with smaller phrase technology. Any transcriptional errors that result from this process are unintentional.

## 2017-03-29 ENCOUNTER — Ambulatory Visit: Payer: 59 | Admitting: Family Medicine

## 2017-04-04 ENCOUNTER — Other Ambulatory Visit: Payer: 59

## 2017-04-05 ENCOUNTER — Ambulatory Visit: Payer: 59 | Admitting: Cardiovascular Disease

## 2017-04-05 ENCOUNTER — Encounter: Payer: Self-pay | Admitting: Cardiovascular Disease

## 2017-04-05 VITALS — BP 118/68 | HR 64 | Ht 61.0 in | Wt 159.9 lb

## 2017-04-05 DIAGNOSIS — E7800 Pure hypercholesterolemia, unspecified: Secondary | ICD-10-CM

## 2017-04-05 DIAGNOSIS — I447 Left bundle-branch block, unspecified: Secondary | ICD-10-CM | POA: Diagnosis not present

## 2017-04-05 NOTE — Assessment & Plan Note (Signed)
History of hyperlipidemia on high-dose Crestor with recent lipid profile performed 10/06/16 revealing an LDL that an increase from 66 in February 2016 up to 107. She really has not changed her diet but has not exercising as much as she had in the past. I'm going to repeat a lipid liver profile.

## 2017-04-05 NOTE — Assessment & Plan Note (Signed)
Chronic. 

## 2017-04-05 NOTE — Patient Instructions (Signed)
Medication Instructions: Your physician recommends that you continue on your current medications as directed. Please refer to the Current Medication list given to you today.  Labwork: Your physician recommends that you return for a FASTING lipid profile and hepatic function panel in mid-January.   Follow-Up: Your physician wants you to follow-up in: 1 year with Dr. Allyson SabalBerry. You will receive a reminder letter in the mail two months in advance. If you don't receive a letter, please call our office to schedule the follow-up appointment.  If you need a refill on your cardiac medications before your next appointment, please call your pharmacy.

## 2017-04-05 NOTE — Progress Notes (Signed)
04/05/2017 Barbara CarneyDiana Aispuro   05/13/1960  [id number]  Primary Physician Farris HasMorrow, Aaron, MD Primary Cardiologist: Runell GessJonathan J Merlen Gurry MD Nicholes CalamityFACP, FACC, FAHA, [place]  HPI:  Barbara Nixon is a 56 y.o.  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 I last saw her  07/19/16. Her risk factors include mild hyperlipidemia on a statin drug and family history with a mother that had a myocardial infarction and bypass surgery at age 58. She has chronic right bundle branch block. She is otherwise asymptomatic. She had a Myoview and echo performed in 2010 with were normal. Since I saw her back in a year ago she's remained completely asymptomatic specifically denying chest pain or shortness of breath although she has had 2 episodes of chest pain that were nocturnal or somewhat concerning for the potential of being an anginal equivalent. She had a Myoview stress test performed 06/24/14 which is low risk and nonischemic. She has had no recurrent symptoms.     Current Meds  Medication Sig  . BIOTIN PO Take 1 capsule by mouth daily.  Marland Kitchen. CALCIUM PO Take 1 Dose by mouth daily.  . CYANOCOBALAMIN PO Take 1 Dose by mouth daily.     Allergies  Allergen Reactions  . Penicillins     REACTION: as child, unspecified  . Statins Other (See Comments)    Myalgias     Social History   Socioeconomic History  . Marital status: Married    Spouse name: Not on file  . Number of children: Not on file  . Years of education: Not on file  . Highest education level: Not on file  Social Needs  . Financial resource strain: Not on file  . Food insecurity - worry: Not on file  . Food insecurity - inability: Not on file  . Transportation needs - medical: Not on file  . Transportation needs - non-medical: Not on file  Occupational History  . Not on file  Tobacco Use  . Smoking status: Never Smoker  . Smokeless tobacco:  Never Used  Substance and Sexual Activity  . Alcohol use: Yes    Alcohol/week: 0.0 oz    Comment: social  . Drug use: No  . Sexual activity: Not on file  Other Topics Concern  . Not on file  Social History Narrative  . Not on file     Review of Systems: General: negative for chills, fever, night sweats or weight changes.  Cardiovascular: negative for chest pain, dyspnea on exertion, edema, orthopnea, palpitations, paroxysmal nocturnal dyspnea or shortness of breath Dermatological: negative for rash Respiratory: negative for cough or wheezing Urologic: negative for hematuria Abdominal: negative for nausea, vomiting, diarrhea, bright red blood per rectum, melena, or hematemesis Neurologic: negative for visual changes, syncope, or dizziness All other systems reviewed and are otherwise negative except as noted above.    Blood pressure 118/68, pulse 64, height 5\' 1"  (1.549 m), weight 159 lb 14.4 oz (72.5 kg).  General appearance: alert and no distress Neck: no adenopathy, no carotid bruit, no JVD, supple, symmetrical, trachea midline and thyroid not enlarged, symmetric, no tenderness/mass/nodules Lungs: clear to auscultation bilaterally Heart: regular rate and rhythm, S1, S2 normal, no murmur, click, rub or gallop Extremities: extremities normal, atraumatic, no cyanosis or edema Pulses: 2+ and symmetric Skin: Skin color, texture, turgor normal. No rashes or lesions Neurologic: Alert and oriented X 3, normal strength and tone. Normal symmetric  reflexes. Normal coordination and gait  EKG sinus rhythm at 64 with left bundle branch block. I personally reviewed this EKG.  ASSESSMENT AND PLAN:   Hyperlipidemia History of hyperlipidemia on high-dose Crestor with recent lipid profile performed 10/06/16 revealing an LDL that an increase from 66 in February 2016 up to 107. She really has not changed her diet but has not exercising as much as she had in the past. I'm going to repeat a lipid  liver profile.  Left bundle branch block Chronic      Runell GessJonathan J. Aiana Nordquist MD [facility] SummitFACP,FACC,FAHA, [facility]SCAI 04/05/2017 12:10 PM

## 2017-04-06 ENCOUNTER — Ambulatory Visit: Payer: Self-pay

## 2017-04-06 ENCOUNTER — Encounter: Payer: Self-pay | Admitting: Family Medicine

## 2017-04-06 ENCOUNTER — Ambulatory Visit: Payer: 59 | Admitting: Family Medicine

## 2017-04-06 VITALS — BP 132/90 | HR 90 | Ht 61.0 in | Wt 159.0 lb

## 2017-04-06 DIAGNOSIS — M47816 Spondylosis without myelopathy or radiculopathy, lumbar region: Secondary | ICD-10-CM | POA: Insufficient documentation

## 2017-04-06 DIAGNOSIS — M25552 Pain in left hip: Secondary | ICD-10-CM

## 2017-04-06 DIAGNOSIS — M7050 Other bursitis of knee, unspecified knee: Secondary | ICD-10-CM | POA: Insufficient documentation

## 2017-04-06 DIAGNOSIS — M7062 Trochanteric bursitis, left hip: Secondary | ICD-10-CM | POA: Diagnosis not present

## 2017-04-06 DIAGNOSIS — M4696 Unspecified inflammatory spondylopathy, lumbar region: Secondary | ICD-10-CM | POA: Diagnosis not present

## 2017-04-06 NOTE — Progress Notes (Signed)
Tawana ScaleZach Jeter Tomey D.O. [facility] [address] [place], [place] [address] Phone: [phone] Subjective:    I'm seeing this patient by the request  of:    CC: Left hip pain, back pain follow-up  BJY:[id number]:Subjective  Barbara CarneyDiana Nixon is a 56 y.o. female coming in for follow up for left hip pain. She is here to talk about the MRI and to receive an injection in the left bursae.  Patient did have what appeared to be more facet injections. Pain no more of the.  Describes it as a dull, throbbing aching sensation.  Waking her up at night.  Has had more of a greater trochanteric bursitis on that side previously.  She would like to talk about right knee pain. She has gotten injections from [facility] in bilateral knees one year ago. She has been having increasing pain in the right knee that causes her to lose sleep.       Past Medical History:  Diagnosis Date  . Anxiety   . Family history of heart disease   . Hypercholesteremia    takes statin  . Left bundle branch block    Past Surgical History:  Procedure Laterality Date  . KNEE ARTHROSCOPY WITH MEDIAL MENISECTOMY  05/18/2012   Procedure: KNEE ARTHROSCOPY WITH MEDIAL MENISECTOMY;  Surgeon: Javier DockerJeffrey C Beane, MD;  [facility] [name];  Service: Orthopedics;  Laterality: Left;  Left Knee Arthroscopy with Partial Medial Menisectomy AND DEBRIDEMENT  . NM MYOCAR PERF WALL MOTION  01/22/2009   normal  . US ECHOCARDIOGRAPHY  01/22/2009   borderline concentric LVH, septal motion is c/wconduction abnormality, mild TR, mild pulmonary hypertension  . WISDOM TOOTH EXTRACTION  unknown   Social History   Socioeconomic History  . Marital status: Married    Spouse name: Not on file  . Number of children: Not on file  . Years of education: Not on file  . Highest education level: Not on file  Social Needs  . Financial resource strain: Not on file  . Food insecurity - worry: Not on file  . Food insecurity - inability: Not on  file  . Transportation needs - medical: Not on file  . Transportation needs - non-medical: Not on file  Occupational History  . Not on file  Tobacco Use  . Smoking status: Never Smoker  . Smokeless tobacco: Never Used  Substance and Sexual Activity  . Alcohol use: Yes    Alcohol/week: 0.0 oz    Comment: social  . Drug use: No  . Sexual activity: Not on file  Other Topics Concern  . Not on file  Social History Narrative  . Not on file   Allergies  Allergen Reactions  . Penicillins     REACTION: as child, unspecified  . Statins Other (See Comments)    Myalgias    Family History  Problem Relation Age of Onset  . Heart attack Mother   . Hypertension Mother   . Hyperlipidemia Mother   . Hypertension Father   . Hyperlipidemia Brother      Past medical history, social, surgical and family history all reviewed in electronic medical record.  No pertanent information unless stated regarding to the chief complaint.   Review of Systems:Review of systems updated and as accurate as of 04/06/17  No headache, visual changes, nausea, vomiting, diarrhea, constipation, dizziness, abdominal pain, skin rash, fevers, chills, night sweats, weight loss, swollen lymph nodes, body aches, joint swelling, muscle aches, chest pain, shortness of breath, mood  changes.   Objective  Blood pressure 132/90, pulse 90, height 5\' 1"  (1.549 m), weight 159 lb (72.1 kg), SpO2 98 %. Systems examined below as of 04/06/17   General: No apparent distress alert and oriented x3 mood and affect normal, dressed appropriately.  HEENT: Pupils equal, extraocular movements intact  Respiratory: Patient's speak in full sentences and does not appear short of breath  Cardiovascular: No lower extremity edema, non tender, no erythema  Skin: Warm dry intact with no signs of infection or rash on extremities or on axial skeleton.  Abdomen: Soft nontender  Neuro: Cranial nerves II through XII are intact, neurovascularly intact  in all extremities with 2+ DTRs and 2+ pulses.  Lymph: No lymphadenopathy of posterior or anterior cervical chain or axillae bilaterally.  Gait normal with good balance and coordination.  MSK:  Non tender with full range of motion and good stability and symmetric strength and tone of shoulders, elbows, wrist, hip, and ankles bilaterally.  Varus deformity of the knees bilaterally.  Patient is more painful over the pedis anserine area than the medial joint space.  Possibly does have some narrowing.  No significant instability. Back Exam:  Inspection: Mild loss of range of motion in all planes Motion: Flexion 35 deg, Extension 20 deg, Side Bending to 35 deg bilaterally,  Rotation to 35 deg bilaterally  SLR laying: Negative  XSLR laying: Negative  Palpable tenderness: Tender to palpation in the paraspinal musculature of lumbar spine. FABER: Positive tightness bilaterally.  Patient does have severe tenderness over the greater trochanteric area on the left side. Sensory change: Gross sensation intact to all lumbar and sacral dermatomes.  Reflexes: 2+ at both patellar tendons, 2+ at achilles tendons, Babinski's downgoing.  Strength at foot  Plantar-flexion: 5/5 Dorsi-flexion: 5/5 Eversion: 5/5 Inversion: 5/5  Leg strength  Quad: 5/5 Hamstring: 5/5 Hip flexor: 5/5 Hip abductors: 5/5  Gait unremarkable.   Procedure: Real-time Ultrasound Guided Injection of left  greater trochanteric bursitis secondary to patient's body habitus Device: GE Logiq Q7  Ultrasound guided injection is preferred based studies that show increased duration, increased effect, greater accuracy, decreased procedural pain, increased response rate, and decreased cost with ultrasound guided versus blind injection.  Verbal informed consent obtained.  Time-out conducted.  Noted no overlying erythema, induration, or other signs of local infection.  Skin prepped in a sterile fashion.  Local anesthesia: Topical Ethyl chloride.  With  sterile technique and under real time ultrasound guidance:  Greater trochanteric area was visualized and patient's bursa was noted. A 22-gauge 3 inch needle was inserted and 4 cc of 0.5% Marcaine and 1 cc of Kenalog 40 mg/dL was injected. Pictures taken Completed without difficulty  Pain immediately resolved suggesting accurate placement of the medication.  Advised to call if fevers/chills, erythema, induration, drainage, or persistent bleeding.  Images permanently stored and available for review in the ultrasound unit.  Impression: Technically successful ultrasound guided injection.     Impression and Recommendations:     This case required medical decision making of moderate complexity.      Note: This dictation was prepared with Dragon dictation along with smaller phrase technology. Any transcriptional errors that result from this process are unintentional.

## 2017-04-06 NOTE — Patient Instructions (Signed)
Good to see you  Call [facility] to get the facet injections ordered. [phone] For the knees start the exercises 3 times a week  Ice 20 minutes 2 times daily. Usually after activity and before bed. Shorten stride length and no incline walking.  After the injections in the back see me again 2-3 weeks after you have them done.  Happy holidays!

## 2017-04-06 NOTE — Assessment & Plan Note (Signed)
Patient given another injection.  Has responded to it previously.  We are going to do a facet injections of the back.  If no improvement will consider piriformis injection.  We discussed icing regimen.  Has not responded well to osteopathic manipulation in the past.  Continue the same medications.

## 2017-04-06 NOTE — Assessment & Plan Note (Signed)
Moderate nature.  Patient will be sent for injection.  This should hopefully help.  Follow-up again 2-3 weeks after the injection.  If no improvement will consider piriformis injection

## 2017-04-06 NOTE — Assessment & Plan Note (Signed)
More likely a pedis anserine bursitis.  I do think the patient does have some underlying arthritis and will consider injections and follow-up.  We discussed which activities of doing which wants to avoid.  Encourage patient to continue to be active though.  Follow-up with me again in 4 weeks

## 2017-04-20 ENCOUNTER — Ambulatory Visit
Admission: RE | Admit: 2017-04-20 | Discharge: 2017-04-20 | Disposition: A | Discharge status: Home / Self Care | Payer: 59 | Source: Ambulatory Visit | Attending: Family Medicine | Admitting: Family Medicine

## 2017-04-20 DIAGNOSIS — M5416 Radiculopathy, lumbar region: Secondary | ICD-10-CM

## 2017-04-20 IMAGING — XA DG FACET JT INJ L OR S SPINE SINGLE LEVEL *L*
2 series · 2 of 2 positions shown · non-contrast
Comparison: none

CLINICAL DATA: Lumbar radiculopathy. Low back pain, left greater
than right. Left-sided pain extends from the hip to the knee.
Bilateral L5-S1 facet arthropathy.

[Series 1: ortho standard · 1 of 1 slices shown (1 of 2)]
[im 1/1]
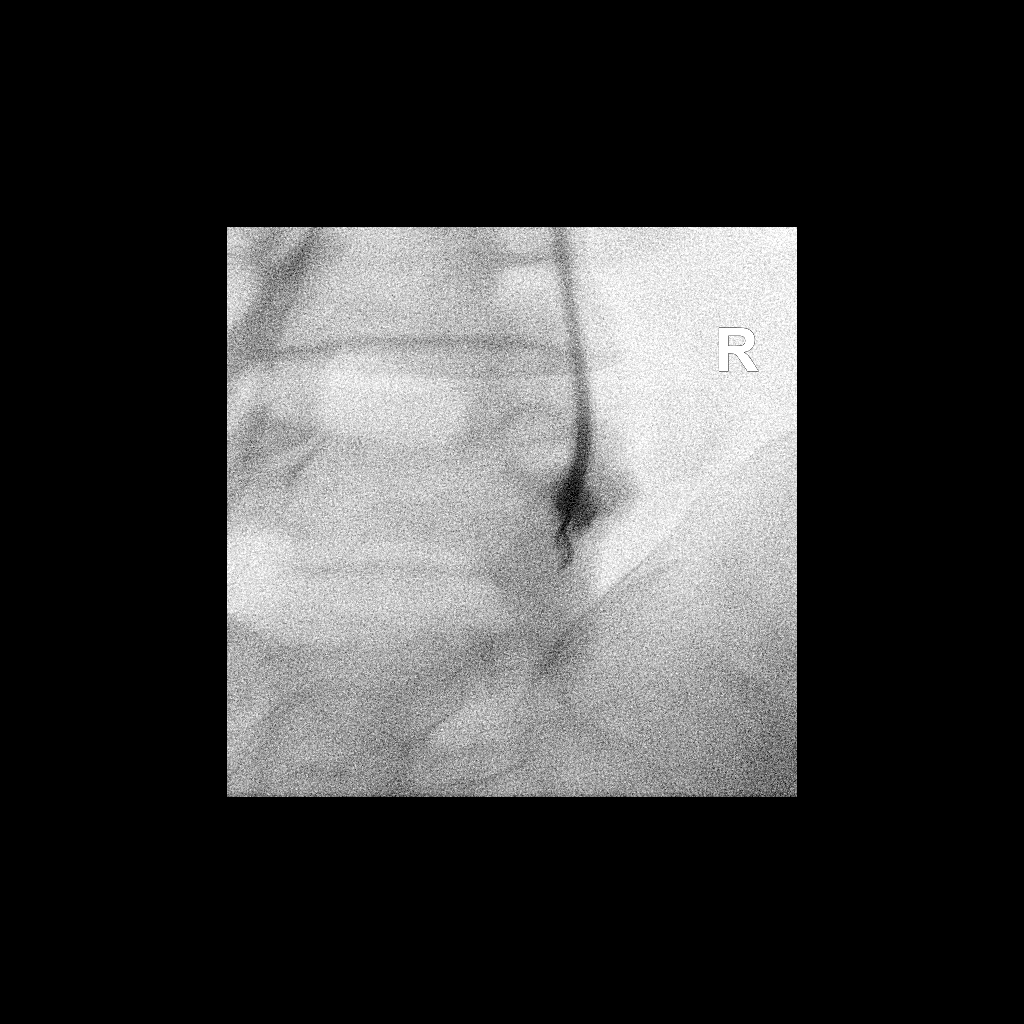

[Series 2: ortho standard · 1 of 1 slices shown (2 of 2)]
[im 1/1]
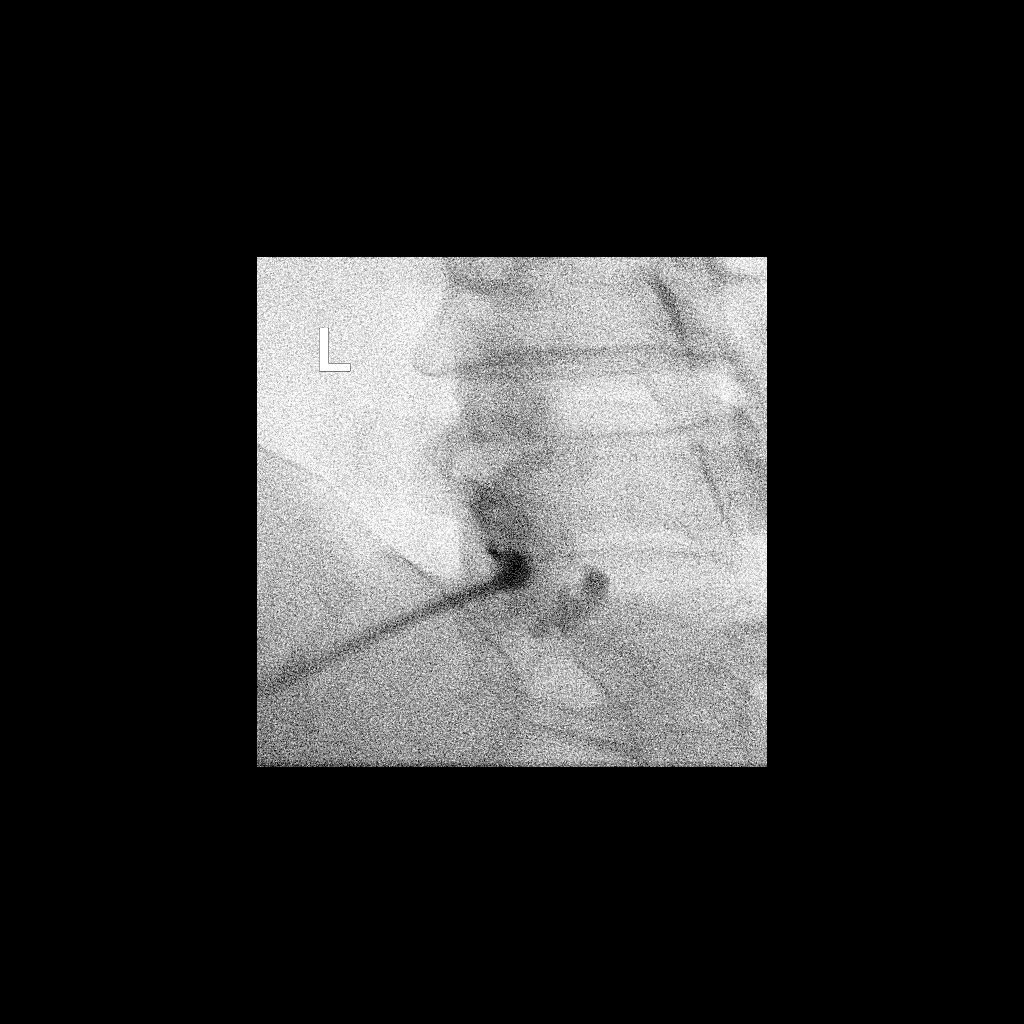

[2 of 2 positions shown; findings below may reference images not displayed]

FLUOROSCOPY TIME:  Radiation Exposure Index (as provided by the
fluoroscopic device): 35.88 uGy*m2

Fluoroscopy Time:  45 seconds

Number of Acquired Images:  0

PROCEDURE:
DG FACET INJECTION; DG FACET INJECTION LEFT

The procedure, risks, benefits, and alternatives were explained to
the patient. Questions regarding the procedure were encouraged and
answered. The patient understands and consents to the procedure.

Right L5-[UI] INJECTION: A posterior oblique approach was taken
to the facet on the right at L5-S1 using a curved 22 gauge spinal
needle. Intra-articular positioning was confirmed by injecting a
small amount of Isovue-M 200. No vascular opacification is seen. 60
mg of Depo-Medrol mixed with 0.5 mL 1% lidocaine were instilled into
the joint. The injection resulted in concordant pain.

Left L5-[UI] INJECTION: A posterior oblique approach was taken to
the facet on the left at L5-S1 using a curved 22 gauge spinal
needle. Intra-articular positioning was confirmed by injecting a
small amount of Isovue-M 200. No vascular opacification is seen. 60
mg of Depo-Medrol mixed with 0.5 mL 1% lidocaine were instilled into
the joint. The injection resulted in concordant pain. The procedure
was well-tolerated.
IMPRESSION: Technically successful first bilateral L5-S1 facet injections.

## 2017-04-20 MED ORDER — IOPAMIDOL (ISOVUE-M 200) INJECTION 41%
1.0000 mL | Freq: Once | INTRAMUSCULAR | Status: AC
  Administered 2017-04-20: 1 mL via INTRA_ARTICULAR

## 2017-04-20 MED ORDER — METHYLPREDNISOLONE ACETATE 40 MG/ML INJ SUSP (RADIOLOG
120.0000 mg | Freq: Once | INTRAMUSCULAR | Status: AC
  Administered 2017-04-20: 120 mg via INTRA_ARTICULAR

## 2017-04-20 NOTE — Discharge Instructions (Signed)

## 2017-05-12 ENCOUNTER — Other Ambulatory Visit: Payer: Self-pay | Admitting: *Deleted

## 2017-05-12 MED ORDER — VENLAFAXINE HCL ER 37.5 MG PO CP24: ORAL_CAPSULE | ORAL | 1 refills | Status: DC

## 2017-05-12 NOTE — Telephone Encounter (Signed)
Refill done.  

## 2017-07-03 LAB — LIPID PANEL
CHOLESTEROL TOTAL: 179 mg/dL (ref 100–199)
Chol/HDL Ratio: 2.2 ratio (ref 0.0–4.4)
HDL: 81 mg/dL (ref >39)
LDL Calculated: 72 mg/dL (ref 0–99)
Triglycerides: 128 mg/dL (ref 0–149)
VLDL CHOLESTEROL CAL: 26 mg/dL (ref 5–40)

## 2017-07-03 LAB — HEPATIC FUNCTION PANEL
ALBUMIN: 4.3 g/dL (ref 3.5–5.5)
ALK PHOS: 69 IU/L (ref 39–117)
ALT: 21 IU/L (ref 0–32)
AST: 21 IU/L (ref 0–40)
BILIRUBIN, DIRECT: 0.19 mg/dL (ref 0.00–0.40)
Bilirubin Total: 0.6 mg/dL (ref 0.0–1.2)
TOTAL PROTEIN: 6.5 g/dL (ref 6.0–8.5)

## 2017-07-16 ENCOUNTER — Other Ambulatory Visit: Payer: Self-pay | Admitting: Cardiovascular Disease

## 2017-07-28 ENCOUNTER — Encounter: Payer: Self-pay | Admitting: Cardiovascular Disease

## 2017-07-28 ENCOUNTER — Ambulatory Visit (INDEPENDENT_AMBULATORY_CARE_PROVIDER_SITE_OTHER): Payer: 59 | Admitting: Cardiovascular Disease

## 2017-07-28 VITALS — BP 132/74 | HR 63 | Ht 61.0 in | Wt 160.4 lb

## 2017-07-28 DIAGNOSIS — E7800 Pure hypercholesterolemia, unspecified: Secondary | ICD-10-CM

## 2017-07-28 DIAGNOSIS — I208 Other forms of angina pectoris: Secondary | ICD-10-CM

## 2017-07-28 DIAGNOSIS — I447 Left bundle-branch block, unspecified: Secondary | ICD-10-CM | POA: Diagnosis not present

## 2017-07-28 MED ORDER — ROSUVASTATIN CALCIUM 40 MG PO TABS: 40.0000 mg | ORAL_TABLET | Freq: Every day | ORAL | 3 refills | Status: DC

## 2017-07-28 NOTE — Assessment & Plan Note (Signed)
chronic

## 2017-07-28 NOTE — Patient Instructions (Signed)

## 2017-07-28 NOTE — Addendum Note (Signed)
Addended by: Evans LanceSTOVER, Riann Oman W on: 07/28/2017 09:15 AM   Modules accepted: Orders

## 2017-07-28 NOTE — Assessment & Plan Note (Signed)
History of hyperlipidemia on high-dose Crestor with recent lipid profile performed 07/03/17 revealed a LDL of 72 and HDL of 81.

## 2017-07-28 NOTE — Progress Notes (Signed)
07/28/2017 Geselle Cardosa   1960/06/24  [id number]  Primary Physician Farris Has, MD Primary Cardiologist: Runell Gess MD Nicholes Calamity, [place]  HPI:  Barbara Nixon is a 57 y.o.  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 Ilastsaw her 04/05/17. Her risk factors include mild hyperlipidemia on a statin drug and family history with a mother that had a myocardial infarction and bypass surgery at age 27. She has chronic right bundle branch block. She is otherwise asymptomatic. She had a Myoview and echo performed in 2010 with were normal. Since I saw her back in a year ago she's remained completely asymptomatic specifically denying chest pain or shortness of breath although she has had 2 episodes of chest pain that were nocturnal or somewhat concerning for the potential of being an anginal equivalent.She had a Myoview stress test performed 06/24/14 which is low risk and nonischemic. She has had no recurrent symptoms.recent lipid profile performed 07/03/17 revealed LDL 72 and HDL of 81. She has started exercising again.     Current Meds  Medication Sig  . BIOTIN PO Take 1 capsule by mouth daily.  Marland Kitchen CALCIUM PO Take 1 Dose by mouth daily.  . CYANOCOBALAMIN PO Take 1 Dose by mouth daily.  . Diclofenac Sodium (PENNSAID) 2 % SOLN Place 2 application onto the skin 2 (two) times daily.  Marland Kitchen FLUoxetine (PROZAC) 20 MG capsule Take 20 mg by mouth daily.  Marland Kitchen ibuprofen (ADVIL,MOTRIN) 200 MG tablet Take 200 mg by mouth every 6 (six) hours as needed.  Marland Kitchen MAGNESIUM PO Take 1 Dose by mouth daily.  . rosuvastatin (CRESTOR) 40 MG tablet Take 1 tablet (40 mg total) by mouth daily.  . [DISCONTINUED] rosuvastatin (CRESTOR) 40 MG tablet TAKE 1 TABLET DAILY     Allergies  Allergen Reactions  . Penicillins     REACTION: as child, unspecified  . Statins Other (See Comments)    Myalgias     Social  History   Socioeconomic History  . Marital status: Married    Spouse name: Not on file  . Number of children: Not on file  . Years of education: Not on file  . Highest education level: Not on file  Occupational History  . Not on file  Social Needs  . Financial resource strain: Not on file  . Food insecurity:    Worry: Not on file    Inability: Not on file  . Transportation needs:    Medical: Not on file    Non-medical: Not on file  Tobacco Use  . Smoking status: Never Smoker  . Smokeless tobacco: Never Used  Substance and Sexual Activity  . Alcohol use: Yes    Alcohol/week: 0.0 oz    Comment: social  . Drug use: No  . Sexual activity: Not on file  Lifestyle  . Physical activity:    Days per week: Not on file    Minutes per session: Not on file  . Stress: Not on file  Relationships  . Social connections:    Talks on phone: Not on file    Gets together: Not on file    Attends religious service: Not on file    Active member of club or organization: Not on file    Attends meetings of clubs or organizations: Not on file    Relationship status: Not on file  . Intimate partner violence:    Fear  of current or ex partner: Not on file    Emotionally abused: Not on file    Physically abused: Not on file    Forced sexual activity: Not on file  Other Topics Concern  . Not on file  Social History Narrative  . Not on file     Review of Systems: General: negative for chills, fever, night sweats or weight changes.  Cardiovascular: negative for chest pain, dyspnea on exertion, edema, orthopnea, palpitations, paroxysmal nocturnal dyspnea or shortness of breath Dermatological: negative for rash Respiratory: negative for cough or wheezing Urologic: negative for hematuria Abdominal: negative for nausea, vomiting, diarrhea, bright red blood per rectum, melena, or hematemesis Neurologic: negative for visual changes, syncope, or dizziness All other systems reviewed and are otherwise  negative except as noted above.    Blood pressure 132/74, pulse 63, height 5\' 1"  (1.549 m), weight 160 lb 6.4 oz (72.8 kg).  General appearance: alert and no distress Neck: no adenopathy, no carotid bruit, no JVD, supple, symmetrical, trachea midline and thyroid not enlarged, symmetric, no tenderness/mass/nodules Lungs: clear to auscultation bilaterally Heart: regular rate and rhythm, S1, S2 normal, no murmur, click, rub or gallop Extremities: extremities normal, atraumatic, no cyanosis or edema Pulses: 2+ and symmetric Skin: Skin color, texture, turgor normal. No rashes or lesions Neurologic: Alert and oriented X 3, normal strength and tone. Normal symmetric reflexes. Normal coordination and gait  EKG sinus rhythm at 63 with left bundle branch block unchanged from prior EKGs. I personally reviewed this EKG.  ASSESSMENT AND PLAN:   Hyperlipidemia History of hyperlipidemia on high-dose Crestor with recent lipid profile performed 07/03/17 revealed a LDL of 72 and HDL of 81.  Left bundle branch block chronic  Chest pain No chest pain since I saw her 4 months ago.      Runell GessJonathan J. Cindel Daugherty MD FACP,FACC,FAHA, [facility] 07/28/2017 8:33 AM

## 2017-07-28 NOTE — Assessment & Plan Note (Signed)
No chest pain since I saw her 4 months ago.

## 2017-08-15 ENCOUNTER — Ambulatory Visit: Payer: 59 | Admitting: Family Medicine

## 2017-08-16 NOTE — Progress Notes (Signed)
[name] [address] [place], [place] [address] Phone: [phone] Subjective:     CC: Back pain follow-up  NGE:[id number]  Barbara Nixon is a 57 y.o. female coming in with complaint of hip pain. She did have 2 facet injections. She is still experiencing pain on the left side in the lumbar spine and hip. She did have about 2 weeks of relief.  Patient is having worsening pain again.  Patient's responded well to the injections previously.  Patient was having more of the buttocks pain.  Thinking about trying to do something else.  Discussed icing regimen and home exercises.  Discussed different things she is been doing intermittently.  New problem.  Bilateral knee pain.  Anteriorly.  Worse with going up or down stairs.  Sometimes seems to be swelling.  Denies any instability.  Rates the severity pain is 7 out of 10 sometimes.      Past Medical History:  Diagnosis Date  . Anxiety   . Family history of heart disease   . Hypercholesteremia    takes statin  . Left bundle branch block    Past Surgical History:  Procedure Laterality Date  . KNEE ARTHROSCOPY WITH MEDIAL MENISECTOMY  05/18/2012   Procedure: KNEE ARTHROSCOPY WITH MEDIAL MENISECTOMY;  Surgeon: [name], MD;  Location: [facility];  Service: Orthopedics;  Laterality: Left;  Left Knee Arthroscopy with Partial Medial Menisectomy AND DEBRIDEMENT  . NM MYOCAR PERF WALL MOTION  01/22/2009   normal  . US ECHOCARDIOGRAPHY  01/22/2009   borderline concentric LVH, septal motion is c/wconduction abnormality, mild TR, mild pulmonary hypertension  . WISDOM TOOTH EXTRACTION  unknown   Social History   Socioeconomic History  . Marital status: Married    Spouse name: Not on file  . Number of children: Not on file  . Years of education: Not on file  . Highest education level: Not on file  Occupational History  . Not on file  Social Needs  . Financial resource strain: Not on file  .  Food insecurity:    Worry: Not on file    Inability: Not on file  . Transportation needs:    Medical: Not on file    Non-medical: Not on file  Tobacco Use  . Smoking status: Never Smoker  . Smokeless tobacco: Never Used  Substance and Sexual Activity  . Alcohol use: Yes    Alcohol/week: 0.0 oz    Comment: social  . Drug use: No  . Sexual activity: Not on file  Lifestyle  . Physical activity:    Days per week: Not on file    Minutes per session: Not on file  . Stress: Not on file  Relationships  . Social connections:    Talks on phone: Not on file    Gets together: Not on file    Attends religious service: Not on file    Active member of club or organization: Not on file    Attends meetings of clubs or organizations: Not on file    Relationship status: Not on file  Other Topics Concern  . Not on file  Social History Narrative  . Not on file   Allergies  Allergen Reactions  . Penicillins     REACTION: as child, unspecified  . Statins Other (See Comments)    Myalgias    Family History  Problem Relation Age of Onset  . Heart attack Mother   . Hypertension Mother   .  Hyperlipidemia Mother   . Hypertension Father   . Hyperlipidemia Brother      Past medical history, social, surgical and family history all reviewed in electronic medical record.  No pertanent information unless stated regarding to the chief complaint.   Review of Systems:Review of systems updated and as accurate as of 08/17/17  No headache, visual changes, nausea, vomiting, diarrhea, constipation, dizziness, abdominal pain, skin rash, fevers, chills, night sweats, weight loss, swollen lymph nodes, body aches, joint swelling, muscle aches, chest pain, shortness of breath, mood changes.   Objective  Blood pressure (!) 102/34, pulse 66, height 5\' 1"  (1.549 m), weight 157 lb (71.2 kg), SpO2 98 %. Systems examined below as of 08/17/17   General: No apparent distress alert and oriented x3 mood and affect  normal, dressed appropriately.  HEENT: Pupils equal, extraocular movements intact  Respiratory: Patient's speak in full sentences and does not appear short of breath  Cardiovascular: No lower extremity edema, non tender, no erythema  Skin: Warm dry intact with no signs of infection or rash on extremities or on axial skeleton.  Abdomen: Soft nontender  Neuro: Cranial nerves II through XII are intact, neurovascularly intact in all extremities with 2+ DTRs and 2+ pulses.  Lymph: No lymphadenopathy of posterior or anterior cervical chain or axillae bilaterally.  Gait normal with good balance and coordination.  MSK:  Non tender with full range of motion and good stability and symmetric strength and tone of shoulders, elbows, wrist, hip, and ankles bilaterally.   Back exam does show loss of lordosis.  Patient does have pain with extension of the back.  Otherwise full range of motion.  Patient does have positive Pearlean BrownieFaber test on the left side.  Severe tenderness over the piriformis muscle.  Bilateral knee exam shows the patient does have lateral tracking of the kneecap.  Positive patella grind positive pain with compression.   Procedure: Real-time Ultrasound Guided Injection of left piriformis tendon Device: GE Logiq Q7 Ultrasound guided injection is preferred based studies that show increased duration, increased effect, greater accuracy, decreased procedural pain, increased response rate, and decreased cost with ultrasound guided versus blind injection.  Verbal informed consent obtained.  Time-out conducted.  Noted no overlying erythema, induration, or other signs of local infection.  Skin prepped in a sterile fashion.  Local anesthesia: Topical Ethyl chloride.  With sterile technique and under real time ultrasound guidance: With a 21-gauge 2 inch needle patient was injected with a total of 0.5 cc of 0.5% Marcaine and 1 cc of Kenalog 40 mg/mL into the piriformis tendon sheath Completed without  difficulty  Pain immediately resolved suggesting accurate placement of the medication.  Advised to call if fevers/chills, erythema, induration, drainage, or persistent bleeding.  Images permanently stored and available for review in the ultrasound unit.  Impression: Technically successful ultrasound guided injection.    Impression and Recommendations:     This case required medical decision making of moderate complexity.      Note: This dictation was prepared with Dragon dictation along with smaller phrase technology. Any transcriptional errors that result from this process are unintentional.

## 2017-08-17 ENCOUNTER — Ambulatory Visit (INDEPENDENT_AMBULATORY_CARE_PROVIDER_SITE_OTHER): Payer: 59 | Admitting: Family Medicine

## 2017-08-17 ENCOUNTER — Ambulatory Visit: Payer: Self-pay

## 2017-08-17 ENCOUNTER — Encounter: Payer: Self-pay | Admitting: Family Medicine

## 2017-08-17 VITALS — BP 102/34 | HR 66 | Ht 61.0 in | Wt 157.0 lb

## 2017-08-17 DIAGNOSIS — M222X1 Patellofemoral disorders, right knee: Secondary | ICD-10-CM | POA: Diagnosis not present

## 2017-08-17 DIAGNOSIS — M47816 Spondylosis without myelopathy or radiculopathy, lumbar region: Secondary | ICD-10-CM

## 2017-08-17 DIAGNOSIS — M222X2 Patellofemoral disorders, left knee: Secondary | ICD-10-CM

## 2017-08-17 DIAGNOSIS — M25559 Pain in unspecified hip: Secondary | ICD-10-CM

## 2017-08-17 DIAGNOSIS — G5702 Lesion of sciatic nerve, left lower limb: Secondary | ICD-10-CM | POA: Insufficient documentation

## 2017-08-17 NOTE — Assessment & Plan Note (Signed)
Injected today.  Tolerated procedure well.  Hopefully this will help with some of the discomfort and pain and will help diagnostically.  Otherwise with patient's continued facet arthritis will consider if patient is a candidate for radiofrequency ablation.  Follow-up again in 3 to 4 weeks

## 2017-08-17 NOTE — Assessment & Plan Note (Signed)
[id number]; 15 additional minutes spent for Therapeutic exercises as stated in above notes.  This included exercises focusing on stretching, strengthening, with significant focus on eccentric aspects.   Long term goals include an improvement in range of motion, strength, endurance as well as avoiding reinjury. Patient's frequency would include in 1-2 times a day, 3-5 times a week for a duration of 6-12 weeks. Patellofemoral Syndrome  Reviewed anatomy using anatomical model and how PFS occurs.  Given rehab exercises handout for VMO, hip abductors, core, entire kinetic chain including proprioception exercises including cone touches, step downs, hip elevations and turn outs.  Could benefit from PT, regular exercise, upright biking, and a PFS knee brace to assist with tracking abnormalities.   Proper technique shown and discussed handout in great detail with ATC.  All questions were discussed and answered.

## 2017-08-17 NOTE — Assessment & Plan Note (Signed)
Short-term benefit with injections.  Could be a candidate for radiofrequency ablation.  Will consider if the piriformis injection does not help

## 2017-08-17 NOTE — Patient Instructions (Signed)
Good to see you  We tried an injection in the piriformis  I hope it helps Given note for work  New exercises for knee  Dansko shoes See me again in 4 weeks if not better will inject knees and then consider radio frequency ablation

## 2018-04-11 ENCOUNTER — Encounter: Payer: 59 | Attending: Family Medicine | Admitting: Registered"

## 2018-04-11 ENCOUNTER — Encounter: Payer: Self-pay | Admitting: Registered"

## 2018-04-11 DIAGNOSIS — R7303 Prediabetes: Secondary | ICD-10-CM | POA: Insufficient documentation

## 2018-04-11 DIAGNOSIS — Z713 Dietary counseling and surveillance: Secondary | ICD-10-CM | POA: Diagnosis not present

## 2018-04-11 NOTE — Patient Instructions (Addendum)
-   Aim to increase fiber intake by having non-starchy vegetables with lunch and dinner.   - Continue to choose fiber-rich options such as whole grains and brown rice.   - Aim to increase physical activity with 2 days/week, at least 30 minutes.   - Alcohol recommendation is 1 glass wine/day for women.   - Increase water intake. Have a water bottle easily accessible while working.

## 2018-04-11 NOTE — Progress Notes (Signed)
  Medical Nutrition Therapy:  Appt start time: 11:15 end time:  12:16.   Assessment:  Primary concerns today: Per referral, recent A1c was 5.8.   Pt expectations: how to change and know what needs to be done; ideas on what she shouldn't be eating/drinking, how to have healthier dinners  Pt states she wants to be be able to change. Pt states she does not read nutrition fact labels; only looks for sugar.  Pt states she lives alone. Pt states she likes rice, beans, wine, pasta, chocolate, water, Bai drinks, chicken, salmon, and coffee (with creamer and sugar). Pt states she does not eat a lot of fruits and vegetables and does not drink soda.    Preferred Learning Style:   No preference indicated   Learning Readiness:   Ready  Change in progress   MEDICATIONS: See list   DIETARY INTAKE:  Usual eating pattern includes 3 meals and 1 snacks per day.  Everyday foods include chicken, salmon, beans, protein shake, protein bar, chili.  Avoided foods include spicy foods.    24-hr recall:  B ( AM): protein powder (21g) +unsweetened almond milk  Snk (10:30 AM): protein bar (21g)  L ( PM): white bean chicken chili  Snk ( PM): none D ( PM): roast + carrots + potatoes or rotisserie chicken + sandwich thins  Snk ( PM): sometimes chocolate  Beverages: wine (3 glasses/night on weekend), water (maybe 16 oz), Bai drink, coffee (with creamer and sugar), unsweetened almond milk  Usual physical activity: gym sporadically, yard work   Estimated energy needs: 1600-1800 calories 180-200 g carbohydrates 120-135 g protein 44-50 g fat  Progress Towards Goal(s):  In progress.   Nutritional Diagnosis:  NB-1.1 Food and nutrition-related knowledge deficit As related to prediabetes.  As evidenced by pt verbalizes incomplete knowledge.    Intervention:  Nutrition education and counseling. Pt was educated and counseled on prediabetes, risk factors, A1c, importance of physical activity, alcohol  recommendations for women, ways to increase water intake, fiber, My Plate, and balanced meals. Pt was in agreement with goals listed.  Goals: - Aim to increase fiber intake by having non-starchy vegetables with lunch and dinner.  - Continue to choose fiber-rich options such as whole grains and brown rice.  - Aim to increase physical activity with 2 days/week, at least 30 minutes.  - Alcohol recommendation is 1 glass wine/day for women.  - Increase water intake. Have a water bottle easily accessible while working.   Teaching Method Utilized:  Visual Auditory Hands on  Handouts given during visit include:  My Plate  Meal Ideas Sheet  Barriers to learning/adherence to lifestyle change: none identified  Demonstrated degree of understanding via:  Teach Back   Monitoring/Evaluation:  Dietary intake, exercise, and body weight prn.

## 2018-08-15 ENCOUNTER — Telehealth: Payer: Self-pay | Admitting: *Deleted

## 2018-08-15 NOTE — Telephone Encounter (Signed)
Virtual Visit Pre-Appointment Phone Call  "(Name), I am calling you today to discuss your upcoming appointment. We are currently trying to limit exposure to the virus that causes COVID-19 by seeing patients at home rather than in the office."  1. "What is the BEST phone number to call the day of the visit?" - include this in appointment notes  2. Do you have or have access to (through a family member/friend) a smartphone with video capability that we can use for your visit?" a. If yes - list this number in appt notes as cell (if different from BEST phone #) and list the appointment type as a VIDEO visit in appointment notes b. If no - list the appointment type as a PHONE visit in appointment notes  3. Confirm consent - "In the setting of the current Covid19 crisis, you are scheduled for a (phone or video) visit with your provider on (date) at (time).  Just as we do with many in-office visits, in order for you to participate in this visit, we must obtain consent.  If you'd like, I can send this to your mychart (if signed up) or email for you to review.  Otherwise, I can obtain your verbal consent now.  All virtual visits are billed to your insurance company just like a normal visit would be.  By agreeing to a virtual visit, we'd like you to understand that the technology does not allow for your provider to perform an examination, and thus may limit your provider's ability to fully assess your condition. If your provider identifies any concerns that need to be evaluated in person, we will make arrangements to do so.  Finally, though the technology is pretty good, we cannot assure that it will always work on either your or our end, and in the setting of a video visit, we may have to convert it to a phone-only visit.  In either situation, we cannot ensure that we have a secure connection.  Are you willing to proceed?" STAFF: Did the patient verbally acknowledge consent to telehealth visit? Document  YES/NO here: YES  4. Advise patient to be prepared - "Two hours prior to your appointment, go ahead and check your blood pressure, pulse, oxygen saturation, and your weight (if you have the equipment to check those) and write them all down. When your visit starts, your provider will ask you for this information. If you have an Apple Watch or Kardia device, please plan to have heart rate information ready on the day of your appointment. Please have a pen and paper handy nearby the day of the visit as well."  5. Give patient instructions for MyChart download to smartphone OR Doximity/Doxy.me as below if video visit (depending on what platform provider is using)  6. Inform patient they will receive a phone call 15 minutes prior to their appointment time (may be from unknown caller ID) so they should be prepared to answer    TELEPHONE CALL NOTE  Rotonda Stankovic has been deemed a candidate for a follow-up tele-health visit to limit community exposure during the Covid-19 pandemic. I spoke with the patient via phone to ensure availability of phone/video source, confirm preferred email & phone number, and discuss instructions and expectations.  I reminded Yowanda Dilbert to be prepared with any vital sign and/or heart rhythm information that could potentially be obtained via home monitoring, at the time of her visit. I reminded Aliani Naillon to expect a phone call prior to her visit.  Raelyn NumberWilliamson, Carollynn Pennywell L, CMA 08/15/2018 3:47 PM   INSTRUCTIONS FOR DOWNLOADING THE MYCHART APP TO SMARTPHONE  - The patient must first make sure to have activated MyChart and know their login information - If Apple, go to [employer] Store and type in MyChart in the search bar and download the app. If Android, ask patient to go to [employer] Play Store and type in [place] in the search bar and download the app. The app is free but as with any other app downloads, their phone may require them to verify saved payment information or Apple/Android  password.  - The patient will need to then log into the app with their MyChart username and password, and select [facility] as their healthcare provider to link the account. When it is time for your visit, go to the MyChart app, find appointments, and click Begin Video Visit. Be sure to Select Allow for your device to access the Microphone and Camera for your visit. You will then be connected, and your provider will be with you shortly.  **If they have any issues connecting, or need assistance please contact MyChart service desk [phone])**  **If using a computer, in order to ensure the best quality for their visit they will need to use either of the following Internet Browsers: [employer] Edge, or Google Chrome**  IF USING DOXIMITY or DOXY.ME - The patient will receive a link just prior to their visit by text.     FULL LENGTH CONSENT FOR TELE-HEALTH VISIT   I hereby voluntarily request, consent and authorize [facility] and its employed or contracted physicians, physician assistants, nurse practitioners or other licensed health care professionals (the Practitioner), to provide me with telemedicine health care services (the Services") as deemed necessary by the treating Practitioner. I acknowledge and consent to receive the Services by the Practitioner via telemedicine. I understand that the telemedicine visit will involve communicating with the Practitioner through live audiovisual communication technology and the disclosure of certain medical information by electronic transmission. I acknowledge that I have been given the opportunity to request an in-person assessment or other available alternative prior to the telemedicine visit and am voluntarily participating in the telemedicine visit.  I understand that I have the right to withhold or withdraw my consent to the use of telemedicine in the course of my care at any time, without affecting my right to future care or treatment,  and that the Practitioner or I may terminate the telemedicine visit at any time. I understand that I have the right to inspect all information obtained and/or recorded in the course of the telemedicine visit and may receive copies of available information for a reasonable fee.  I understand that some of the potential risks of receiving the Services via telemedicine include:   Delay or interruption in medical evaluation due to technological equipment failure or disruption;  Information transmitted may not be sufficient (e.g. poor resolution of images) to allow for appropriate medical decision making by the Practitioner; and/or   In rare instances, security protocols could fail, causing a breach of personal health information.  Furthermore, I acknowledge that it is my responsibility to provide information about my medical history, conditions and care that is complete and accurate to the best of my ability. I acknowledge that Practitioner's advice, recommendations, and/or decision may be based on factors not within their control, such as incomplete or inaccurate data provided by me or distortions of diagnostic images or specimens that may result from electronic transmissions. I understand that  the practice of medicine is not an exact science and that Practitioner makes no warranties or guarantees regarding treatment outcomes. I acknowledge that I will receive a copy of this consent concurrently upon execution via email to the email address I last provided but may also request a printed copy by calling the office of [facility].    I understand that my insurance will be billed for this visit.   I have read or had this consent read to me.  I understand the contents of this consent, which adequately explains the benefits and risks of the Services being provided via telemedicine.   I have been provided ample opportunity to ask questions regarding this consent and the Services and have had my questions  answered to my satisfaction.  I give my informed consent for the services to be provided through the use of telemedicine in my medical care  By participating in this telemedicine visit I agree to the above.       Cardiac Questionnaire:    Since your last visit or hospitalization:    1. Have you been having new or worsening chest pain? NO   2. Have you been having new or worsening shortness of breath? NO 3. Have you been having new or worsening leg swelling, wt gain, or increase in abdominal girth (pants fitting more tightly)? NO   4. Have you had any passing out spells? NO    *A YES to any of these questions would result in the appointment being kept. *If all the answers to these questions are NO, we should indicate that given the current situation regarding the worldwide coronarvirus pandemic, at the recommendation of the CDC, we are looking to limit gatherings in our waiting area, and thus will reschedule their appointment beyond four weeks from today.   _____________   COVID-19 Pre-Screening Questions:   Do you currently have a fever? NO  Have you recently travelled on a cruise, internationally, or to [place], [place], [place], [place], [place], or [place], [place] [employer]) ? NO  Have you been in contact with someone that is currently pending confirmation of Covid19 testing or has been confirmed to have the Covid19 virus? NO  Are you currently experiencing fatigue or cough? NO        Spoke with patient and she agreed to have a video visit with Dr. Allyson Sabal on August 17, 2018. We reviewed her medications, history, allergies, and pharmacy.

## 2018-08-16 ENCOUNTER — Telehealth: Payer: Self-pay | Admitting: Cardiovascular Disease

## 2018-08-16 NOTE — Telephone Encounter (Signed)
Smartphone/ virtual consent/ my chart/ pre reg completed °

## 2018-08-17 ENCOUNTER — Telehealth (INDEPENDENT_AMBULATORY_CARE_PROVIDER_SITE_OTHER): Payer: Self-pay | Admitting: Cardiovascular Disease

## 2018-08-17 ENCOUNTER — Telehealth: Payer: Self-pay | Admitting: Cardiovascular Disease

## 2018-08-17 ENCOUNTER — Telehealth: Payer: Self-pay

## 2018-08-17 DIAGNOSIS — Z8249 Family history of ischemic heart disease and other diseases of the circulatory system: Secondary | ICD-10-CM

## 2018-08-17 DIAGNOSIS — I4510 Unspecified right bundle-branch block: Secondary | ICD-10-CM

## 2018-08-17 DIAGNOSIS — E782 Mixed hyperlipidemia: Secondary | ICD-10-CM

## 2018-08-17 MED ORDER — ROSUVASTATIN CALCIUM 40 MG PO TABS: 40.0000 mg | ORAL_TABLET | Freq: Every day | ORAL | 3 refills | Status: DC

## 2018-08-17 NOTE — Telephone Encounter (Signed)
Follow Up:    Pt said she received your call. She needs to talk to you about her prescription.

## 2018-08-17 NOTE — Telephone Encounter (Signed)
Spoke with pt who would like Crestor Rx refill sent to [facility] on file. Refill completed. Pt made aware and verbalized understanding

## 2018-08-17 NOTE — Telephone Encounter (Signed)
Per dpr left detailed message of avs instructions to f/u in 1 yr

## 2018-08-17 NOTE — Progress Notes (Signed)
Virtual Visit via Video Note   This visit type was conducted due to national recommendations for restrictions regarding the COVID-19 Pandemic (e.g. social distancing) in an effort to limit this patient's exposure and mitigate transmission in our community.  Due to her co-morbid illnesses, this patient is at least at moderate risk for complications without adequate follow up.  This format is felt to be most appropriate for this patient at this time.  All issues noted in this document were discussed and addressed.  A limited physical exam was performed with this format.  Please refer to the patient's chart for her consent to telehealth for [facility].   Evaluation Performed:  Follow-up visit  Date:  08/17/2018   ID:  Barbara CarneyDiana Scholler, DOB 11/27/1960, MRN [id number]  Patient Location: Home Provider Location: Home  PCP:  Farris HasMorrow, Aaron, MD  Cardiologist: Dr. Nanetta BattyJonathan Rayan Dyal Electrophysiologist:  None   Chief Complaint: 1 year follow-up cardiac risk factors  History of Present Illness:    Barbara CarneyDiana Delaguila is a 58y.o.  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  Apparently, because of COVID-19, she has been furloughed for 5 weeks.  Ilastsaw 07/28/2017. Her risk factors include mild hyperlipidemia on a statin drug and family history with a mother that had a myocardial infarction and bypass surgery at age 60. She has chronic right bundle branch block. She is otherwise asymptomatic. She had a Myoview and echo performed in 2010 with were normal. Since I saw her back in a year ago she's remained completely asymptomatic specifically denying chest pain or shortness of breath although she has had 2 episodes of chest pain that were nocturnal or somewhat concerning for the potential of being an anginal equivalent.She had a Myoview stress test performed 06/24/14 which is low risk and nonischemic.  Since I  saw her a year ago she is remained stable.  She has had no recurrent symptoms of shortness of breath or chest pain.  Her last lipid profile performed 03/16/2018 revealed total cholesterol 183, LDL 79 and HDL 75 on Crestor.   The patient does not have symptoms concerning for COVID-19 infection (fever, chills, cough, or new shortness of breath).    Past Medical History:  Diagnosis Date  . Anxiety   . Family history of heart disease   . Hypercholesteremia    takes statin  . Left bundle branch block    Past Surgical History:  Procedure Laterality Date  . KNEE ARTHROSCOPY WITH MEDIAL MENISECTOMY  05/18/2012   Procedure: KNEE ARTHROSCOPY WITH MEDIAL MENISECTOMY;  Surgeon: Javier DockerJeffrey C Beane, MD;  [facility];  Service: Orthopedics;  Laterality: Left;  Left Knee Arthroscopy with Partial Medial Menisectomy AND DEBRIDEMENT  . NM MYOCAR PERF WALL MOTION  01/22/2009   normal  . US ECHOCARDIOGRAPHY  01/22/2009   borderline concentric LVH, septal motion is c/wconduction abnormality, mild TR, mild pulmonary hypertension  . WISDOM TOOTH EXTRACTION  unknown     Current Meds  Medication Sig  . BIOTIN PO Take 1 capsule by mouth daily.  Marland Kitchen. CALCIUM PO Take 1 Dose by mouth daily.  . CYANOCOBALAMIN PO Take 1 Dose by mouth daily.  Marland Kitchen. FLUoxetine (PROZAC) 20 MG capsule Take 20 mg by mouth daily.  Marland Kitchen. ibuprofen (ADVIL,MOTRIN) 200 MG tablet Take 200 mg by mouth every 6 (six) hours as needed.  Marland Kitchen. MAGNESIUM PO Take 1 Dose by mouth daily.  . rosuvastatin (CRESTOR)  40 MG tablet Take 1 tablet (40 mg total) by mouth daily.     Allergies:   Penicillins   Social History   Tobacco Use  . Smoking status: Never Smoker  . Smokeless tobacco: Never Used  Substance Use Topics  . Alcohol use: Yes    Alcohol/week: 0.0 standard drinks    Comment: social  . Drug use: No     Family Hx: The patient's family history includes Heart attack in her mother; Hyperlipidemia in her brother and mother;  Hypertension in her father and mother.  ROS:   Please see the history of present illness.     All other systems reviewed and are negative.   Prior CV studies:   The following studies were reviewed today:  None  Labs/Other Tests and Data Reviewed:    EKG:  No ECG reviewed.  Recent Labs: No results found for requested labs within last 8760 hours.   Recent Lipid Panel Lab Results  Component Value Date/Time   CHOL 179 07/03/2017 09:34 AM   TRIG 128 07/03/2017 09:34 AM   HDL 81 07/03/2017 09:34 AM   CHOLHDL 2.2 07/03/2017 09:34 AM   CHOLHDL 2.8 10/06/2016 09:25 AM   LDLCALC 72 07/03/2017 09:34 AM    Wt Readings from Last 3 Encounters:  08/17/18 156 lb (70.8 kg)  08/17/17 157 lb (71.2 kg)  07/28/17 160 lb 6.4 oz (72.8 kg)     Objective:    Vital Signs:  BP 136/76   Pulse 66   Ht 5\' 6"  (1.676 m)   Wt 156 lb (70.8 kg)   BMI 25.18 kg/m    VITAL SIGNS:  reviewed GEN:  no acute distress RESPIRATORY:  normal respiratory effort, symmetric expansion NEURO:  alert and oriented x 3, no obvious focal deficit PSYCH:  normal affect  ASSESSMENT & PLAN:    1. Chest pain- no further symptoms.  Negative Myoview 06/24/2014 2. Hyperlipidemia- on Crestor lipid profile performed 03/16/2018 revealing total cholesterol 183, LDL 79 and HDL 75 3. Right bundle branch block- chronic 4. Family history of heart disease- mother had bypass surgery at age 41  COVID-19 Education: The signs and symptoms of COVID-19 were discussed with the patient and how to seek care for testing (follow up with PCP or arrange E-visit).  The importance of social distancing was discussed today.  Time:   Today, I have spent 9 minutes with the patient with telehealth technology discussing the above problems.     Medication Adjustments/Labs and Tests Ordered: Current medicines are reviewed at length with the patient today.  Concerns regarding medicines are outlined above.   Tests Ordered: No orders of the  defined types were placed in this encounter.   Medication Changes: No orders of the defined types were placed in this encounter.   Disposition:  Follow up in 1 year(s)  Signed, Nanetta Batty, MD  08/17/2018 9:40 AM    [facility]

## 2018-08-17 NOTE — Patient Instructions (Signed)

## 2018-10-19 ENCOUNTER — Other Ambulatory Visit: Payer: Self-pay | Admitting: Cardiovascular Disease

## 2019-03-25 ENCOUNTER — Encounter: Payer: Self-pay | Admitting: Gastroenterology

## 2019-04-10 ENCOUNTER — Encounter: Payer: Self-pay | Admitting: Gastroenterology

## 2019-04-15 ENCOUNTER — Telehealth: Payer: Self-pay

## 2019-04-15 NOTE — Telephone Encounter (Signed)
   [facility] Pre-operative Risk Assessment    Request for surgical clearance:  1. What type of surgery is being performed? LEFT TOTAL KNEE ARTHROPLASTY   2. When is this surgery scheduled? 07-08-2019  3. What type of clearance is required (medical clearance vs. Pharmacy clearance to hold med vs. Both)? MEDICAL  4. Are there any medications that need to be held prior to surgery and how long? NONE   5. Practice name and name of physician performing surgery? [facility] Gaynelle Arabian, MD ATTN KELLY  6. What is your office phone number [phone]    7.   What is your office fax number [phone]  8.   Anesthesia type (None, local, MAC, general) ? CHOICE

## 2019-04-15 NOTE — Telephone Encounter (Signed)
Surgical Clearance 

## 2019-04-16 NOTE — Telephone Encounter (Signed)
   Primary Cardiologist: Quay Burow, MD  Chart reviewed as part of pre-operative protocol coverage. Patient was contacted 04/16/2019 in reference to pre-operative risk assessment for pending surgery as outlined below.  Barbara Nixon was last seen on 08/17/18 by Dr. Gwenlyn Found.  Since that day, Barbara Nixon has done well. She does pilates 3 times per week and takes walks, achieving >4Mets, without any exertional symptoms. She has family hx of CAD but no personal history.   Therefore, based on ACC/AHA guidelines, the patient would be at acceptable risk for the planned procedure without further cardiovascular testing.   I will route this recommendation to the requesting party via Epic fax function and remove from pre-op pool.  Please call with questions.  Daune Perch, NP 04/16/2019, 11:02 AM

## 2019-05-16 ENCOUNTER — Ambulatory Visit (INDEPENDENT_AMBULATORY_CARE_PROVIDER_SITE_OTHER): Payer: 59 | Admitting: Gastroenterology

## 2019-05-16 ENCOUNTER — Encounter: Payer: Self-pay | Admitting: Gastroenterology

## 2019-05-16 ENCOUNTER — Other Ambulatory Visit: Payer: Self-pay

## 2019-05-16 DIAGNOSIS — K219 Gastro-esophageal reflux disease without esophagitis: Secondary | ICD-10-CM | POA: Diagnosis not present

## 2019-05-16 DIAGNOSIS — R1310 Dysphagia, unspecified: Secondary | ICD-10-CM | POA: Diagnosis not present

## 2019-05-16 DIAGNOSIS — Z01818 Encounter for other preprocedural examination: Secondary | ICD-10-CM | POA: Diagnosis not present

## 2019-05-16 DIAGNOSIS — R1319 Other dysphagia: Secondary | ICD-10-CM

## 2019-05-16 DIAGNOSIS — Z1211 Encounter for screening for malignant neoplasm of colon: Secondary | ICD-10-CM

## 2019-05-16 MED ORDER — SUPREP BOWEL PREP KIT 17.5-3.13-1.6 GM/177ML PO SOLN: 1.0000 | ORAL | 0 refills | Status: DC

## 2019-05-16 NOTE — Progress Notes (Signed)
Barbara Nixon:  History: Barbara Nixon  Referring provider: London Pepper, MD  Reason for consult/chief complaint: Dysphagia (food gets stuck often, feels like it is stuck in her chest. ) and Colon Cancer Screening (last colon 10 years ago, no problems currently. )   Subjective  HPI: This is a very pleasant 59 year old woman referred by primary care for dysphagia and colon cancer screening.  At her most recent routine health visit last month she described intermittent dysphagia.  She was also felt to be due for screening colonoscopy.  Last colonoscopy with Dr. Olevia Perches in December 2010 was a complete exam with a good prep and no polyps.  Barbara Nixon reports about a year of frequent solid food dysphagia, with food feeling stuck in the chest, causing discomfort.  She has frequent episodes of heartburn without much regurgitation, and takes OTC acid suppression as needed.  She denies nausea, vomiting, early satiety or weight loss.  Bowel habits have been regular without rectal bleeding.   ROS:  Review of Systems  Constitutional: Negative for appetite change and unexpected weight change.  HENT: Negative for mouth sores and voice change.   Eyes: Negative for pain and redness.  Respiratory: Negative for cough and shortness of breath.   Cardiovascular: Negative for chest pain and palpitations.  Genitourinary: Negative for dysuria and hematuria.  Musculoskeletal: Negative for arthralgias and myalgias.  Skin: Negative for pallor and rash.  Neurological: Negative for weakness and headaches.  Hematological: Negative for adenopathy.  Psychiatric/Behavioral:       Depression and anxiety stable     Past Medical History: Past Medical History:  Diagnosis Date  . Anxiety   . Bundle branch block   . Family history of heart disease   . Hypercholesteremia    takes statin  . Left bundle branch block      Past Surgical History: Past Surgical History:    Procedure Laterality Date  . KNEE ARTHROSCOPY WITH MEDIAL MENISECTOMY  05/18/2012   Procedure: KNEE ARTHROSCOPY WITH MEDIAL MENISECTOMY;  Surgeon: Johnn Hai, MD;  Location: [facility];  Service: Orthopedics;  Laterality: Left;  Left Knee Arthroscopy with Partial Medial Menisectomy AND DEBRIDEMENT  . NM MYOCAR PERF WALL MOTION  01/22/2009   normal  . US ECHOCARDIOGRAPHY  01/22/2009   borderline concentric LVH, septal motion is c/wconduction abnormality, mild TR, mild pulmonary hypertension  . WISDOM TOOTH EXTRACTION  unknown     Family History: Family History  Problem Relation Age of Onset  . Heart attack Mother   . Hypertension Mother   . Hyperlipidemia Mother   . Hypertension Father   . Hyperlipidemia Brother     Social History: Social History   Socioeconomic History  . Marital status: Divorced    Spouse name: Not on file  . Number of children: 2  . Years of education: Not on file  . Highest education level: Not on file  Occupational History  . Not on file  Tobacco Use  . Smoking status: Never Smoker  . Smokeless tobacco: Never Used  Substance and Sexual Activity  . Alcohol use: Yes    Alcohol/week: 0.0 standard drinks    Comment: social  . Drug use: No  . Sexual activity: Not on file  Other Topics Concern  . Not on file  Social History Narrative  . Not on file   Social Determinants of Health   Financial Resource Strain:   . Difficulty of Paying Living Expenses: Not on file  Food Insecurity:   . Worried About Charity fundraiser in the Last Year: Not on file  . Ran Out of Food in the Last Year: Not on file  Transportation Needs:   . Lack of Transportation (Medical): Not on file  . Lack of Transportation (Non-Medical): Not on file  Physical Activity:   . Days of Exercise per Week: Not on file  . Minutes of Exercise per Session: Not on file  Stress:   . Feeling of Stress : Not on file  Social Connections:   . Frequency of Communication  with Friends and Family: Not on file  . Frequency of Social Gatherings with Friends and Family: Not on file  . Attends Religious Services: Not on file  . Active Member of Clubs or Organizations: Not on file  . Attends Archivist Meetings: Not on file  . Marital Status: Not on file    Allergies: Allergies  Allergen Reactions  . Penicillins     REACTION: as child, unspecified    Outpatient Meds: Current Outpatient Medications  Medication Sig Dispense Refill  . BIOTIN PO Take 1 capsule by mouth daily.    . CYANOCOBALAMIN PO Take 1 Dose by mouth daily.    Marland Kitchen FLUoxetine (PROZAC) 20 MG capsule Take 20 mg by mouth daily.    Marland Kitchen ibuprofen (ADVIL,MOTRIN) 200 MG tablet Take 200 mg by mouth every 6 (six) hours as needed.    Marland Kitchen MAGNESIUM PO Take 1 Dose by mouth daily.    . rosuvastatin (CRESTOR) 40 MG tablet TAKE 1 TABLET BY MOUTH EVERY DAY 90 tablet 3  . Na Sulfate-K Sulfate-Mg Sulf (SUPREP BOWEL PREP KIT) 17.5-3.13-1.6 GM/177ML SOLN Take 1 kit by mouth as directed. 324 mL 0   No current facility-administered medications for this visit.      ___________________________________________________________________ Objective   Exam:  BP 118/70   Pulse 72   Temp 97.6 F (36.4 C)   Ht 5' (1.524 m)   Wt 160 lb (72.6 kg)   BMI 31.25 kg/m    General: Well-appearing, normal vocal quality.  Eyes: sclera anicteric, no redness  ENT: oral mucosa moist without lesions, no cervical or supraclavicular lymphadenopathy  CV: RRR without murmur, S1/S2, no JVD, no peripheral edema  Resp: clear to auscultation bilaterally, normal RR and effort noted  GI: soft, no tenderness, with active bowel sounds. No guarding or palpable organomegaly noted.  Skin; warm and dry, no rash or jaundice noted  Neuro: awake, alert and oriented x 3. Normal gross motor function and fluent speech    Assessment: Encounter Diagnoses  Name Primary?  . Esophageal dysphagia   . Gastroesophageal reflux  disease, unspecified whether esophagitis present   . Special screening for malignant neoplasms, colon   . Encounter for other preprocedural examination     Over a year of solid food dysphagia with underlying symptoms of GERD.  Suspect reflux related stricture. Average risk for colorectal cancer. Plan:  EGD with probable dilation.   Screening colonoscopy.  She was agreeable after discussion of procedure and risks.  The benefits and risks of the planned procedure were described in detail with the patient or (when appropriate) their health care proxy.  Risks were outlined as including, but not limited to, bleeding, infection, perforation, adverse medication reaction leading to cardiac or pulmonary decompensation, pancreatitis (if ERCP).  The limitation of incomplete mucosal visualization was also discussed.  No guarantees or warranties were given.  Depending on endoscopy results, may need to start regular dosing  of PPI if significant reflux related changes.  Thank you for the courtesy of this consult.  Please call me with any questions or concerns.  Nelida Meuse III  CC: Referring provider noted above

## 2019-05-16 NOTE — Patient Instructions (Signed)
If you are age 61 or older, your body mass index should be between 23-30. Your Body mass index is 31.25 kg/m. If this is out of the aforementioned range listed, please consider follow up with your Primary Care Provider.  If you are age 22 or younger, your body mass index should be between 19-25. Your Body mass index is 31.25 kg/m. If this is out of the aformentioned range listed, please consider follow up with your Primary Care Provider.   You have been scheduled for an endoscopy and colonoscopy. Please follow the written instructions given to you at your visit today. Please pick up your prep supplies at the pharmacy within the next 1-3 days. If you use inhalers (even only as needed), please bring them with you on the day of your procedure.  Due to recent changes in healthcare laws, you may see the results of your imaging and laboratory studies on MyChart before your provider has had a chance to review them.  We understand that in some cases there may be results that are confusing or concerning to you. Not all laboratory results come back in the same time frame and the provider may be waiting for multiple results in order to interpret others.  Please give [place] 48 hours in order for your provider to thoroughly review all the results before contacting the office for clarification of your results.   It was a pleasure to see you today!  Dr. Myrtie Neither

## 2019-05-28 ENCOUNTER — Other Ambulatory Visit: Payer: Self-pay | Admitting: Family Medicine

## 2019-05-28 ENCOUNTER — Ambulatory Visit
Admission: RE | Admit: 2019-05-28 | Discharge: 2019-05-28 | Disposition: A | Discharge status: Home / Self Care | Payer: 59 | Source: Ambulatory Visit | Attending: Family Medicine | Admitting: Family Medicine

## 2019-05-28 DIAGNOSIS — R059 Cough, unspecified: Secondary | ICD-10-CM

## 2019-05-28 DIAGNOSIS — R05 Cough: Secondary | ICD-10-CM

## 2019-07-04 ENCOUNTER — Other Ambulatory Visit: Payer: Self-pay | Admitting: Gastroenterology

## 2019-07-04 ENCOUNTER — Ambulatory Visit (INDEPENDENT_AMBULATORY_CARE_PROVIDER_SITE_OTHER): Payer: 59

## 2019-07-04 DIAGNOSIS — Z1159 Encounter for screening for other viral diseases: Secondary | ICD-10-CM

## 2019-07-05 LAB — SARS CORONAVIRUS 2 (TAT 6-24 HRS): SARS Coronavirus 2: NEGATIVE

## 2019-07-08 ENCOUNTER — Ambulatory Visit (AMBULATORY_SURGERY_CENTER): Payer: 59 | Admitting: Gastroenterology

## 2019-07-08 ENCOUNTER — Ambulatory Visit: Admit: 2019-07-08 | Payer: Self-pay | Admitting: Orthopedic Surgery

## 2019-07-08 ENCOUNTER — Encounter: Payer: Self-pay | Admitting: Gastroenterology

## 2019-07-08 ENCOUNTER — Other Ambulatory Visit: Payer: Self-pay

## 2019-07-08 VITALS — BP 123/70 | HR 62 | Temp 96.9°F | Resp 16 | Ht 60.0 in | Wt 160.0 lb

## 2019-07-08 DIAGNOSIS — K222 Esophageal obstruction: Secondary | ICD-10-CM

## 2019-07-08 DIAGNOSIS — K219 Gastro-esophageal reflux disease without esophagitis: Secondary | ICD-10-CM | POA: Diagnosis not present

## 2019-07-08 DIAGNOSIS — Z1211 Encounter for screening for malignant neoplasm of colon: Secondary | ICD-10-CM

## 2019-07-08 DIAGNOSIS — K449 Diaphragmatic hernia without obstruction or gangrene: Secondary | ICD-10-CM | POA: Diagnosis not present

## 2019-07-08 DIAGNOSIS — R1310 Dysphagia, unspecified: Secondary | ICD-10-CM | POA: Diagnosis not present

## 2019-07-08 DIAGNOSIS — R1319 Other dysphagia: Secondary | ICD-10-CM

## 2019-07-08 SURGERY — ARTHROPLASTY, KNEE, TOTAL
Anesthesia: Choice | Site: Knee | Laterality: Left

## 2019-07-08 MED ORDER — SODIUM CHLORIDE 0.9 % IV SOLN: 500.0000 mL | Freq: Once | INTRAVENOUS | Status: DC

## 2019-07-08 MED ORDER — OMEPRAZOLE 40 MG PO CPDR: 40.0000 mg | DELAYED_RELEASE_CAPSULE | Freq: Every day | ORAL | 1 refills | Status: DC

## 2019-07-08 NOTE — Op Note (Signed)
[facility] Patient Name: Barbara Nixon Procedure Date: 07/08/2019 3:52 PM MRN: [id number] Endoscopist: Mallie Mussel L. Loletha Carrow , MD Age: 59 Referring MD:  Date of Birth: 1961/04/17 Gender: Female Account #: [id number] Procedure:                Colonoscopy Indications:              Screening for colorectal malignant neoplasm (last                            colonoscopy 2010) Medicines:                Monitored Anesthesia Care Procedure:                Pre-Anesthesia Assessment:                           - Prior to the procedure, a History and Physical                            was performed, and patient medications and                            allergies were reviewed. The patient's tolerance of                            previous anesthesia was also reviewed. The risks                            and benefits of the procedure and the sedation                            options and risks were discussed with the patient.                            All questions were answered, and informed consent                            was obtained. Prior Anticoagulants: The patient has                            taken no previous anticoagulant or antiplatelet                            agents. ASA Grade Assessment: II - A patient with                            mild systemic disease. After reviewing the risks                            and benefits, the patient was deemed in                            satisfactory condition to undergo the procedure.  After obtaining informed consent, the colonoscope                            was passed under direct vision. Throughout the                            procedure, the patient's blood pressure, pulse, and                            oxygen saturations were monitored continuously. The                            Colonoscope was introduced through the anus and                            advanced to the the cecum, identified by                        appendiceal orifice and ileocecal valve. The                            colonoscopy was somewhat difficult due to a                            tortuous colon. Successful completion of the                            procedure was aided by changing the patient to a                            supine position and withdrawing the scope and                            replacing with the pediatric colonoscope. The                            patient tolerated the procedure well. The quality                            of the bowel preparation was excellent. The                            ileocecal valve, appendiceal orifice, and rectum                            were photographed. Scope In: 4:13:27 PM Scope Out: 4:27:02 PM Scope Withdrawal Time: 0 hours 7 minutes 53 seconds  Total Procedure Duration: 0 hours 13 minutes 35 seconds  Findings:                 The perianal and digital rectal examinations were                            normal.  The entire examined colon appeared normal on direct                            and retroflexion views. Complications:            No immediate complications. Estimated Blood Loss:     Estimated blood loss: none. Impression:               - The entire examined colon is normal on direct and                            retroflexion views.                           - No specimens collected. Recommendation:           - Patient has a contact number available for                            emergencies. The signs and symptoms of potential                            delayed complications were discussed with the                            patient. Return to normal activities tomorrow.                            Written discharge instructions were provided to the                            patient.                           - Resume previous diet.                           - Continue present medications.                            - Repeat colonoscopy in 10 years for screening                            purposes.                           - See the other procedure note for documentation of                            additional recommendations. Stefon Ramthun L. Myrtie Neither, MD 07/08/2019 4:46:37 PM This report has been signed electronically.

## 2019-07-08 NOTE — Progress Notes (Signed)
To PACU, VSS. Report to [email or web address] 

## 2019-07-08 NOTE — Op Note (Signed)
[facility] Patient Name: Barbara Nixon Procedure Date: 07/08/2019 3:51 PM MRN: [id number] Endoscopist: Sherilyn Cooter L. Myrtie Neither , MD Age: 59 Referring MD:  Date of Birth: 1961-02-15 Gender: Female Account #: [id number] Procedure:                Upper GI endoscopy Indications:              Esophageal dysphagia, Esophageal reflux symptoms                            that recur despite appropriate therapy Medicines:                Monitored Anesthesia Care Procedure:                Pre-Anesthesia Assessment:                           - Prior to the procedure, a History and Physical                            was performed, and patient medications and                            allergies were reviewed. The patient's tolerance of                            previous anesthesia was also reviewed. The risks                            and benefits of the procedure and the sedation                            options and risks were discussed with the patient.                            All questions were answered, and informed consent                            was obtained. Prior Anticoagulants: The patient has                            taken no previous anticoagulant or antiplatelet                            agents. ASA Grade Assessment: II - A patient with                            mild systemic disease. After reviewing the risks                            and benefits, the patient was deemed in                            satisfactory condition to undergo the procedure.  After obtaining informed consent, the endoscope was                            passed under direct vision. Throughout the                            procedure, the patient's blood pressure, pulse, and                            oxygen saturations were monitored continuously. The                            Endoscope was introduced through the mouth, and                            advanced to the second  part of duodenum. The upper                            GI endoscopy was accomplished without difficulty.                            The patient tolerated the procedure well. Scope In: Scope Out: Findings:                 The larynx was normal.                           One benign-appearing, intrinsic moderate stenosis                            was found at the gastroesophageal junction. This                            stenosis measured 1.4 cm (inner diameter) x less                            than one cm (in length). The stenosis was                            traversed. A TTS dilator was passed through the                            scope. Dilation with a 16-17-18 mm balloon dilator                            was performed to 18 mm. Scope able to move without                            resistance with 73mm balloon inflation, then                            reinspected. The dilation site was examined and  showed no change. Scope able to move with some                            resistance at 31mm balloon inflation, no mucosal                            disruption occurred.                           A 2 cm hiatal hernia was present.                           There is no endoscopic evidence of Barrett's                            esophagus or areas of erosion in the entire                            esophagus.                           The stomach was normal.                           The cardia and gastric fundus were normal on                            retroflexion. (Hill Grade 3)                           The examined duodenum was normal. Complications:            No immediate complications. Estimated Blood Loss:     Estimated blood loss: none. Impression:               Dysphagia felt to be a combination of mild                            stricture and reflux-related dysmotility.                           - Normal larynx.                           -  Benign-appearing esophageal stenosis. Dilated.                           - 2 cm hiatal hernia.                           - Normal stomach.                           - Normal examined duodenum.                           - No specimens collected. Recommendation:           -  Patient has a contact number available for                            emergencies. The signs and symptoms of potential                            delayed complications were discussed with the                            patient. Return to normal activities tomorrow.                            Written discharge instructions were provided to the                            patient.                           - Resume previous diet.                           - Use Prilosec (omeprazole) 40 mg PO daily for 8                            weeks. Disp# 30, RF 1                           - Return to my office in 8 weeks. Denym Rahimi L. Loletha Carrow, MD 07/08/2019 4:54:02 PM This report has been signed electronically.

## 2019-07-08 NOTE — Patient Instructions (Signed)
Please read handouts provided. Continue present medications. Use Prilosec 40 mg daily for 8 weeks. Return to see Dr. Myrtie Neither at office in 8 weeks.      YOU HAD AN ENDOSCOPIC PROCEDURE TODAY AT THE [facility]:   Refer to the procedure report that was given to you for any specific questions about what was found during the examination.  If the procedure report does not answer your questions, please call your gastroenterologist to clarify.  If you requested that your care partner not be given the details of your procedure findings, then the procedure report has been included in a sealed envelope for you to review at your convenience later.  YOU SHOULD EXPECT: Some feelings of bloating in the abdomen. Passage of more gas than usual.  Walking can help get rid of the air that was put into your GI tract during the procedure and reduce the bloating. If you had a lower endoscopy (such as a colonoscopy or flexible sigmoidoscopy) you may notice spotting of blood in your stool or on the toilet paper. If you underwent a bowel prep for your procedure, you may not have a normal bowel movement for a few days.  Please Note:  You might notice some irritation and congestion in your nose or some drainage.  This is from the oxygen used during your procedure.  There is no need for concern and it should clear up in a day or so.  SYMPTOMS TO REPORT IMMEDIATELY:   Following lower endoscopy (colonoscopy or flexible sigmoidoscopy):  Excessive amounts of blood in the stool  Significant tenderness or worsening of abdominal pains  Swelling of the abdomen that is new, acute  Fever of 100F or higher   Following upper endoscopy (EGD)  Vomiting of blood or coffee ground material  New chest pain or pain under the shoulder blades  Painful or persistently difficult swallowing  New shortness of breath  Fever of 100F or higher  Black, tarry-looking stools  For urgent or emergent issues, a gastroenterologist  can be reached at any hour by calling [phone]. Do not use MyChart messaging for urgent concerns.    DIET:  We do recommend a small meal at first, but then you may proceed to your regular diet.  Drink plenty of fluids but you should avoid alcoholic beverages for 24 hours.  ACTIVITY:  You should plan to take it easy for the rest of today and you should NOT DRIVE or use heavy machinery until tomorrow (because of the sedation medicines used during the test).    FOLLOW UP: Our staff will call the number listed on your records 48-72 hours following your procedure to check on you and address any questions or concerns that you may have regarding the information given to you following your procedure. If we do not reach you, we will leave a message.  We will attempt to reach you two times.  During this call, we will ask if you have developed any symptoms of COVID 19. If you develop any symptoms (ie: fever, flu-like symptoms, shortness of breath, cough etc.) before then, please call [phone].  If you test positive for Covid 19 in the 2 weeks post procedure, please call and report this information to [place].    If any biopsies were taken you will be contacted by phone or by letter within the next 1-3 weeks.  Please call us at [phone] if you have not heard about the biopsies in 3 weeks.    SIGNATURES/CONFIDENTIALITY:  You and/or your care partner have signed paperwork which will be entered into your electronic medical record.  These signatures attest to the fact that that the information above on your After Visit Summary has been reviewed and is understood.  Full responsibility of the confidentiality of this discharge information lies with you and/or your care-partner.

## 2019-07-08 NOTE — Progress Notes (Signed)
Pt's states no medical or surgical changes since previsit or office visit.  JB - temp DT - vitals 

## 2019-07-08 NOTE — Progress Notes (Signed)
Called to room to assist during endoscopic procedure.  Patient ID and intended procedure confirmed with present staff. Received instructions for my participation in the procedure from the performing physician.  

## 2019-07-09 ENCOUNTER — Encounter: Payer: Self-pay | Admitting: *Deleted

## 2019-07-10 ENCOUNTER — Telehealth: Payer: Self-pay

## 2019-07-10 NOTE — Telephone Encounter (Signed)
  Follow up Call-  Call back number 07/08/2019  Post procedure Call Back phone  # [phone]  Permission to leave phone message Yes  Some recent data might be hidden     Patient questions:  Do you have a fever, pain , or abdominal swelling? No. Pain Score  0 *  Have you tolerated food without any problems? Yes.    Have you been able to return to your normal activities? Yes.    Do you have any questions about your discharge instructions: Diet   No. Medications  No. Follow up visit  No.  Do you have questions or concerns about your Care? No.  Actions: * If pain score is 4 or above: No action needed, pain <4.  1. Have you developed a fever since your procedure? no  2.   Have you had an respiratory symptoms (SOB or cough) since your procedure? no  3.   Have you tested positive for COVID 19 since your procedure no  4.   Have you had any family members/close contacts diagnosed with the COVID 19 since your procedure?  no   If yes to any of these questions please route to Laverna Peace, RN and Jennye Boroughs, Charity fundraiser.

## 2019-07-31 ENCOUNTER — Other Ambulatory Visit: Payer: Self-pay | Admitting: Gastroenterology

## 2019-08-12 ENCOUNTER — Other Ambulatory Visit: Payer: Self-pay | Admitting: Cardiovascular Disease

## 2019-08-28 ENCOUNTER — Other Ambulatory Visit: Payer: Self-pay | Admitting: Gastroenterology

## 2019-08-30 ENCOUNTER — Ambulatory Visit: Payer: 59 | Admitting: Gastroenterology

## 2019-10-16 ENCOUNTER — Ambulatory Visit (INDEPENDENT_AMBULATORY_CARE_PROVIDER_SITE_OTHER): Payer: 59 | Admitting: Gastroenterology

## 2019-10-16 ENCOUNTER — Encounter: Payer: Self-pay | Admitting: Gastroenterology

## 2019-10-16 VITALS — BP 132/74 | HR 68 | Ht 60.0 in | Wt 155.0 lb

## 2019-10-16 DIAGNOSIS — K222 Esophageal obstruction: Secondary | ICD-10-CM | POA: Diagnosis not present

## 2019-10-16 DIAGNOSIS — K219 Gastro-esophageal reflux disease without esophagitis: Secondary | ICD-10-CM

## 2019-10-16 MED ORDER — OMEPRAZOLE 40 MG PO CPDR: 40.0000 mg | DELAYED_RELEASE_CAPSULE | Freq: Every day | ORAL | 4 refills | Status: DC

## 2019-10-16 MED ORDER — OMEPRAZOLE 40 MG PO CPDR: 40.0000 mg | DELAYED_RELEASE_CAPSULE | Freq: Every day | ORAL | 0 refills | Status: DC

## 2019-10-16 NOTE — Patient Instructions (Signed)
If you are age 61 or older, your body mass index should be between 23-30. Your Body mass index is 30.27 kg/m. If this is out of the aforementioned range listed, please consider follow up with your Primary Care Provider.  If you are age 25 or younger, your body mass index should be between 19-25. Your Body mass index is 30.27 kg/m. If this is out of the aformentioned range listed, please consider follow up with your Primary Care Provider.   It was a pleasure to see you today!  Dr. Myrtie Neither

## 2019-10-16 NOTE — Progress Notes (Signed)
[facility] Progress Note  Chief Complaint: GERD  Subjective  History: Last seen in office January of this year for about a year of intermittent solid food dysphagia in the setting of chronic reflux symptoms.  She was also due for colon cancer screening. On 07/08/2019, colonoscopy was a complete exam (tortuous left colon), good preparation and no polyps seen. 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  Balloon diameter to 18 mm performed, which did not cause mucosal disruption.  Omeprazole 40 mg daily was started then.  Barbara Nixon reports feeling fairly well since I last saw her.  Dysphagia resolved, and she also has been more careful cutting and chewing food well.  She took the omeprazole daily for 8 weeks, usually in the morning, and had significant improvement in pyrosis and feelings of regurgitation.  She would still sometimes get nocturnal symptoms and have to sleep on more pillows.  She had notices symptoms more often if she has pizza, spicy food or be couple glasses of wine she might have on weekends.  Since she ran out of the medicine a couple of weeks ago, she has noticed more frequent symptoms.  ROS: Cardiovascular:  no chest pain Respiratory: no dyspnea  The patient's Past Medical, Family and Social History were reviewed and are on file in the EMR.  Objective:  Med list reviewed  Current Outpatient Medications:  .  BIOTIN PO, Take 1 capsule by mouth daily., Disp: , Rfl:  .  CYANOCOBALAMIN PO, Take 1 Dose by mouth daily., Disp: , Rfl:  .  FLUoxetine (PROZAC) 20 MG capsule, Take 20 mg by mouth daily., Disp: , Rfl:  .  ibuprofen (ADVIL,MOTRIN) 200 MG tablet, Take 200 mg by mouth every 6 (six) hours as needed., Disp: , Rfl:  .  MAGNESIUM PO, Take 1 Dose by mouth daily., Disp: , Rfl:  .  omeprazole (PRILOSEC) 40 MG capsule, Take 1 capsule (40 mg total) by mouth daily. Use Prilosec ( omeprazole ) 40 mg  daily for 8 weeks., Disp: 90 capsule, Rfl: 0 .  rosuvastatin (CRESTOR) 40 MG tablet, Take 1 tablet (40 mg total) by mouth daily. Please schedule annual appt with Dr. Allyson Sabal for refills. [phone]. 1st attempt., Disp: 30 tablet, Rfl: 0   Vital signs in last 24 hrs: Vitals:   10/16/19 1611  BP: 132/74  Pulse: 68     No exam, entire 20-minute visit spent in discussion.  Labs:   ___________________________________________ Radiologic studies:   ____________________________________________ Other:   _____________________________________________ Assessment & Plan  Assessment: Encounter Diagnoses  Name Primary?  . Gastroesophageal reflux disease without esophagitis Yes  . Esophageal stricture    Reflux related esophageal stricture, dysphagia resolved after balloon dilation (though no mucosal disruption was achieved with that dilation).  I suspect that her treatment of the reflux also improved a probable reflux related dysmotility.  Persistent GERD symptoms, now recurring off PPI therapy.  Along with a diagram of the anatomy, we discussed the limitation of acid suppression therapy, breadth of diet and lifestyle changes required for reflux control, the possibility of reflux related complications such as esophagitis, stricture or Barrett's esophagus.  Other anatomic considerations such as gastric outlet obstruction or hiatal hernia may contribute to reflux.  Plan: Resume omeprazole 40 mg, but take it with the evening meal.  Diet and lifestyle measures reviewed, stressed portion control and not eating in the evening.  If she has persistent  symptoms despite this, or if she does not want to take medicine long-term, then we can certainly have a discussion about antireflux surgery or endoscopic procedure.  I briefly discussed these issues including TIF, which she might be a candidate for with only a 2 cm hiatal hernia.  She was interested, would like to consider it further and continue  taking the medicine and focus on diet and lifestyle changes.   20 minutes were spent on this encounter (including chart review, history/exam, counseling/coordination of care, and documentation)  Barbara Nixon

## 2019-11-27 ENCOUNTER — Other Ambulatory Visit: Payer: Self-pay

## 2019-11-27 ENCOUNTER — Ambulatory Visit (INDEPENDENT_AMBULATORY_CARE_PROVIDER_SITE_OTHER): Payer: 59 | Admitting: Cardiovascular Disease

## 2019-11-27 ENCOUNTER — Encounter: Payer: Self-pay | Admitting: Cardiovascular Disease

## 2019-11-27 DIAGNOSIS — I447 Left bundle-branch block, unspecified: Secondary | ICD-10-CM | POA: Diagnosis not present

## 2019-11-27 DIAGNOSIS — I208 Other forms of angina pectoris: Secondary | ICD-10-CM | POA: Diagnosis not present

## 2019-11-27 DIAGNOSIS — E782 Mixed hyperlipidemia: Secondary | ICD-10-CM

## 2019-11-27 MED ORDER — ROSUVASTATIN CALCIUM 40 MG PO TABS: 40.0000 mg | ORAL_TABLET | Freq: Every day | ORAL | 3 refills | Status: DC

## 2019-11-27 NOTE — Assessment & Plan Note (Signed)
History of chest pain in the past, none recently with a negative Myoview in 2010 and 2016.

## 2019-11-27 NOTE — Assessment & Plan Note (Signed)
Chronic. 

## 2019-11-27 NOTE — Progress Notes (Signed)
11/27/2019 Barbara Nixon   08/25/1960  [id number]  Primary Physician Farris Has, MD Primary Cardiologist: Runell Gess MD Nicholes Calamity, [place]  HPI:  Barbara Nixon is a 59 y.o.  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  Apparently, because of COVID-19, she has been furloughed for 5 weeks.  Ilastsaw  saw her for a virtual telemedicine video visit 08/17/2018. Her risk factors include mild hyperlipidemia on a statin drug and family history with a mother that had a myocardial infarction and bypass surgery at age 1. She has chronic right bundle branch block. She is otherwise asymptomatic. She had a Myoview and echo performed in 2010 with were normal. Since I saw her back in a year ago she's remained completely asymptomatic specifically denying chest pain or shortness of breath although she has had 2 episodes of chest pain that were nocturnal or somewhat concerning for the potential of being an anginal equivalent.She had a Myoview stress test performed 06/24/14 which is low risk and nonischemic.  Since I saw her a year ago she is remained stable. She has had no recurrent chest pain or shortness of breath. Her most recent lipid profile performed 04/23/2019 revealed total cholesterol 201, LDL 102 and HDL of 83.  Current Meds  Medication Sig  . BIOTIN PO Take 1 capsule by mouth daily.  . CYANOCOBALAMIN PO Take 1 Dose by mouth daily.  Marland Kitchen FLUoxetine (PROZAC) 20 MG capsule Take 20 mg by mouth daily.  Marland Kitchen ibuprofen (ADVIL,MOTRIN) 200 MG tablet Take 200 mg by mouth every 6 (six) hours as needed.  Marland Kitchen MAGNESIUM PO Take 1 Dose by mouth daily.  Marland Kitchen omeprazole (PRILOSEC) 40 MG capsule Take 1 capsule (40 mg total) by mouth daily. Use Prilosec ( omeprazole ) 40 mg daily for 8 weeks.  . rosuvastatin (CRESTOR) 40 MG tablet Take 1 tablet (40 mg total) by mouth daily.  . [DISCONTINUED] rosuvastatin  (CRESTOR) 40 MG tablet Take 1 tablet (40 mg total) by mouth daily. Please schedule annual appt with Dr. Allyson Sabal for refills. [phone]. 1st attempt.     Allergies  Allergen Reactions  . Penicillins     REACTION: as child, unspecified    Social History   Socioeconomic History  . Marital status: Divorced    Spouse name: Not on file  . Number of children: 2  . Years of education: Not on file  . Highest education level: Not on file  Occupational History  . Not on file  Tobacco Use  . Smoking status: Never Smoker  . Smokeless tobacco: Never Used  Vaping Use  . Vaping Use: Never used  Substance and Sexual Activity  . Alcohol use: Yes    Alcohol/week: 0.0 standard drinks    Comment: social  . Drug use: No  . Sexual activity: Not on file  Other Topics Concern  . Not on file  Social History Narrative  . Not on file   Social Determinants of Health   Financial Resource Strain:   . Difficulty of Paying Living Expenses:   Food Insecurity:   . Worried About Programme researcher, broadcasting/film/video in the Last Year:   . Barista in the Last Year:   Transportation Needs:   . Freight forwarder (Medical):   Marland Kitchen Lack of Transportation (Non-Medical):   Physical Activity:   . Days of Exercise per Week:   . Minutes of Exercise  per Session:   Stress:   . Feeling of Stress :   Social Connections:   . Frequency of Communication with Friends and Family:   . Frequency of Social Gatherings with Friends and Family:   . Attends Religious Services:   . Active Member of Clubs or Organizations:   . Attends Banker Meetings:   Marland Kitchen Marital Status:   Intimate Partner Violence:   . Fear of Current or Ex-Partner:   . Emotionally Abused:   Marland Kitchen Physically Abused:   . Sexually Abused:      Review of Systems: General: negative for chills, fever, night sweats or weight changes.  Cardiovascular: negative for chest pain, dyspnea on exertion, edema, orthopnea, palpitations, paroxysmal nocturnal  dyspnea or shortness of breath Dermatological: negative for rash Respiratory: negative for cough or wheezing Urologic: negative for hematuria Abdominal: negative for nausea, vomiting, diarrhea, bright red blood per rectum, melena, or hematemesis Neurologic: negative for visual changes, syncope, or dizziness All other systems reviewed and are otherwise negative except as noted above.    Blood pressure 136/78, pulse 69, height 5' (1.524 m), weight 158 lb (71.7 kg), SpO2 98 %.  General appearance: alert and no distress Neck: no adenopathy, no carotid bruit, no JVD, supple, symmetrical, trachea midline and thyroid not enlarged, symmetric, no tenderness/mass/nodules Lungs: clear to auscultation bilaterally Heart: regular rate and rhythm, S1, S2 normal, no murmur, click, rub or gallop Extremities: extremities normal, atraumatic, no cyanosis or edema Pulses: 2+ and symmetric Skin: Skin color, texture, turgor normal. No rashes or lesions Neurologic: Alert and oriented X 3, normal strength and tone. Normal symmetric reflexes. Normal coordination and gait  EKG sinus rhythm at 69 with left bundle branch block. I personally reviewed this EKG.  ASSESSMENT AND PLAN:   Hyperlipidemia History of hyperlipidemia on high-dose statin therapy with lipid profile performed 04/23/2019 revealing total cholesterol 201, LDL 102 and HDL of 83. She does admit to dietary indiscretion.  Left bundle branch block Chronic  Chest pain History of chest pain in the past, none recently with a negative Myoview in 2010 and 2016.      Runell Gess MD FACP,FACC,FAHA, [facility] 11/27/2019 4:58 PM

## 2019-11-27 NOTE — Assessment & Plan Note (Signed)
History of hyperlipidemia on high-dose statin therapy with lipid profile performed 04/23/2019 revealing total cholesterol 201, LDL 102 and HDL of 83. She does admit to dietary indiscretion.

## 2019-11-27 NOTE — Patient Instructions (Signed)
Medication Instructions:  Your physician recommends that you continue on your current medications as directed. Please refer to the Current Medication list given to you today.  *If you need a refill on your cardiac medications before your next appointment, please call your pharmacy*   Follow-Up: At [facility], you and your health needs are our priority.  As part of our continuing mission to provide you with exceptional heart care, we have created designated Provider Care Teams.  These Care Teams include your primary Cardiologist (physician) and Advanced Practice Providers (APPs -  Physician Assistants and Nurse Practitioners) who all work together to provide you with the care you need, when you need it.  We recommend signing up for the patient portal called "MyChart".  Sign up information is provided on this After Visit Summary.  MyChart is used to connect with patients for Virtual Visits (Telemedicine).  Patients are able to view lab/test results, encounter notes, upcoming appointments, etc.  Non-urgent messages can be sent to your provider as well.   To learn more about what you can do with MyChart, go to [email or web address].    Your next appointment:   12 month(s)  The format for your next appointment:   In Person  Provider:   You may see Jonathan Berry, MD or one of the following Advanced Practice Providers on your designated Care Team:    Luke Kilroy, PA-C  Callie Goodrich, PA-C  Jesse Cleaver, FNP    Other Instructions   

## 2019-12-23 ENCOUNTER — Other Ambulatory Visit: Payer: Self-pay | Admitting: Gastroenterology

## 2020-10-30 ENCOUNTER — Other Ambulatory Visit: Payer: Self-pay | Admitting: Cardiovascular Disease

## 2020-12-16 ENCOUNTER — Other Ambulatory Visit: Payer: Self-pay | Admitting: Gastroenterology

## 2021-03-16 ENCOUNTER — Other Ambulatory Visit: Payer: Self-pay | Admitting: Gastroenterology

## 2021-03-16 ENCOUNTER — Other Ambulatory Visit: Payer: Self-pay

## 2021-03-16 MED ORDER — OMEPRAZOLE 40 MG PO CPDR: 40.0000 mg | DELAYED_RELEASE_CAPSULE | Freq: Every day | ORAL | 0 refills | Status: AC

## 2021-03-16 NOTE — Telephone Encounter (Signed)
Patient has moved to the [employer] and states that she has not found a GI doctor or PCP yet. Patient said she would call back when she found days that she would be visiting her son in [place].

## 2021-03-16 NOTE — Telephone Encounter (Signed)
A 90-day prescription with 0 refills can be sent, which would give her time to either see me or find a GI physician closer to where she lives.  Even if she does see me this time, she should still find a GI physician closer to her new home for her long-term GI care for when she needs in person evaluation for this or other issues that may arise.  HD

## 2021-03-16 NOTE — Telephone Encounter (Signed)
Received another pharmacy refill request on this medication.  Patient was last seen June 2021.  Please contact her and arrange a follow-up office visit with me if she would like continued prescriptions, as we should discuss her condition and decide if long-term medication is her best plan.  If appointment is scheduled, prescription can be renewed for 90-day supply with 0 refills.  HD

## 2021-04-06 ENCOUNTER — Ambulatory Visit (INDEPENDENT_AMBULATORY_CARE_PROVIDER_SITE_OTHER): Payer: 59 | Admitting: Cardiovascular Disease

## 2021-04-06 ENCOUNTER — Encounter: Payer: Self-pay | Admitting: Cardiovascular Disease

## 2021-04-06 ENCOUNTER — Other Ambulatory Visit: Payer: Self-pay

## 2021-04-06 DIAGNOSIS — I447 Left bundle-branch block, unspecified: Secondary | ICD-10-CM | POA: Diagnosis not present

## 2021-04-06 DIAGNOSIS — E782 Mixed hyperlipidemia: Secondary | ICD-10-CM

## 2021-04-06 DIAGNOSIS — I208 Other forms of angina pectoris: Secondary | ICD-10-CM

## 2021-04-06 LAB — HEPATIC FUNCTION PANEL
ALT: 16 IU/L (ref 0–32)
AST: 20 IU/L (ref 0–40)
Albumin: 4.6 g/dL (ref 3.8–4.8)
Alkaline Phosphatase: 75 IU/L (ref 44–121)
Bilirubin Total: 0.5 mg/dL (ref 0.0–1.2)
Bilirubin, Direct: 0.14 mg/dL (ref 0.00–0.40)
Total Protein: 6.9 g/dL (ref 6.0–8.5)

## 2021-04-06 LAB — LIPID PANEL
Chol/HDL Ratio: 2.5 ratio (ref 0.0–4.4)
Cholesterol, Total: 232 mg/dL — ABNORMAL HIGH (ref 100–199)
HDL: 92 mg/dL (ref >39)
LDL Chol Calc (NIH): 116 mg/dL — ABNORMAL HIGH (ref 0–99)
Triglycerides: 140 mg/dL (ref 0–149)
VLDL Cholesterol Cal: 24 mg/dL (ref 5–40)

## 2021-04-06 MED ORDER — ROSUVASTATIN CALCIUM 40 MG PO TABS: 40.0000 mg | ORAL_TABLET | Freq: Every day | ORAL | 3 refills | Status: AC

## 2021-04-06 NOTE — Assessment & Plan Note (Signed)
History of hyperlipidemia on statin therapy with lipid profile performed 04/23/2019 revealing total cholesterol 201, LDL 102 and HDL of 83.  I am going to recheck a lipid and liver profile this morning.

## 2021-04-06 NOTE — Patient Instructions (Signed)
Medication Instructions:  Your physician recommends that you continue on your current medications as directed. Please refer to the Current Medication list given to you today.  *If you need a refill on your cardiac medications before your next appointment, please call your pharmacy*   Lab Work: Your physician recommends that you have labs drawn today: Lipid/liver profile  If you have labs (blood work) drawn today and your tests are completely normal, you will receive your results only by: MyChart Message (if you have MyChart) OR A paper copy in the mail If you have any lab test that is abnormal or we need to change your treatment, we will call you to review the results.   Testing/Procedures: Dr. Allyson Sabal has ordered a CT coronary calcium score. This test is done at [address]. This is $99 out of pocket.   Coronary CalciumScan A coronary calcium scan is an imaging test used to look for deposits of calcium and other fatty materials (plaques) in the inner lining of the blood vessels of the heart (coronary arteries). These deposits of calcium and plaques can partly clog and narrow the coronary arteries without producing any symptoms or warning signs. This puts a person at risk for a heart attack. This test can detect these deposits before symptoms develop. Tell a health care provider about: Any allergies you have. All medicines you are taking, including vitamins, herbs, eye drops, creams, and over-the-counter medicines. Any problems you or family members have had with anesthetic medicines. Any blood disorders you have. Any surgeries you have had. Any medical conditions you have. Whether you are pregnant or may be pregnant. What are the risks? Generally, this is a safe procedure. However, problems may occur, including: Harm to a pregnant woman and her unborn baby. This test involves the use of radiation. Radiation exposure can be dangerous to a pregnant woman and her unborn  baby. If you are pregnant, you generally should not have this procedure done. Slight increase in the risk of cancer. This is because of the radiation involved in the test. What happens before the procedure? No preparation is needed for this procedure. What happens during the procedure? You will undress and remove any jewelry around your neck or chest. You will put on a hospital gown. Sticky electrodes will be placed on your chest. The electrodes will be connected to an electrocardiogram (ECG) machine to record a tracing of the electrical activity of your heart. A CT scanner will take pictures of your heart. During this time, you will be asked to lie still and hold your breath for 2-3 seconds while a picture of your heart is being taken. The procedure may vary among health care providers and hospitals. What happens after the procedure? You can get dressed. You can return to your normal activities. It is up to you to get the results of your test. Ask your health care provider, or the department that is doing the test, when your results will be ready. Summary A coronary calcium scan is an imaging test used to look for deposits of calcium and other fatty materials (plaques) in the inner lining of the blood vessels of the heart (coronary arteries). Generally, this is a safe procedure. Tell your health care provider if you are pregnant or may be pregnant. No preparation is needed for this procedure. A CT scanner will take pictures of your heart. You can return to your normal activities after the scan is done. This information is not intended to  replace advice given to you by your health care provider. Make sure you discuss any questions you have with your health care provider. Document Released: 10/08/2007 Document Revised: 02/29/2016 Document Reviewed: 02/29/2016 Elsevier Interactive Patient Education  2017 [employer].    Follow-Up: At [facility], you and your health needs are our  priority.  As part of our continuing mission to provide you with exceptional heart care, we have created designated Provider Care Teams.  These Care Teams include your primary Cardiologist (physician) and Advanced Practice Providers (APPs -  Physician Assistants and Nurse Practitioners) who all work together to provide you with the care you need, when you need it.  We recommend signing up for the patient portal called "MyChart".  Sign up information is provided on this After Visit Summary.  MyChart is used to connect with patients for Virtual Visits (Telemedicine).  Patients are able to view lab/test results, encounter notes, upcoming appointments, etc.  Non-urgent messages can be sent to your provider as well.   To learn more about what you can do with MyChart, go to [email or web address].    Your next appointment:   12 month(s)  The format for your next appointment:   In Person  Provider:   Nanetta Batty, MD

## 2021-04-06 NOTE — Assessment & Plan Note (Signed)
Chronic. 

## 2021-04-06 NOTE — Progress Notes (Signed)
04/06/2021 Barbara Nixon   May 06, 1960  [id number]  Primary Physician London Pepper, MD Primary Cardiologist: Lorretta Harp MD Lupe Carney, [place]  HPI:  Barbara Nixon is a 60 y.o.  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  I last saw her in the office 11/27/2019.  Her risk factors include mild hyperlipidemia on a statin drug and family history with a mother that had a myocardial infarction and bypass surgery at age 88. She has chronic right bundle branch block. She is otherwise asymptomatic. She had a Myoview and echo performed in 2010 with were normal. Since I saw her back in a year ago she's remained completely asymptomatic specifically denying chest pain or shortness of breath although she has had 2 episodes of chest pain that were nocturnal or somewhat concerning for the potential of being an anginal equivalent. She had a Myoview stress test performed 06/24/14 which is low risk and nonischemic.   Since I saw her a year ago she is remained stable.  She has moved to the [place] and works remotely.  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  She gets occasional atypical chest pain.   Current Meds  Medication Sig   FLUoxetine (PROZAC) 20 MG capsule Take 20 mg by mouth daily.   ibuprofen (ADVIL,MOTRIN) 200 MG tablet Take 200 mg by mouth every 6 (six) hours as needed.   omeprazole (PRILOSEC) 40 MG capsule Take 1 capsule (40 mg total) by mouth daily.   rosuvastatin (CRESTOR) 40 MG tablet TAKE 1 TABLET DAILY     Allergies  Allergen Reactions   Penicillins     REACTION: as child, unspecified    Social History   Socioeconomic History   Marital status: Divorced    Spouse name: Not on file   Number of children: 2   Years of education: Not on file   Highest education level: Not on file  Occupational History   Not on file  Tobacco Use    Smoking status: Never   Smokeless tobacco: Never  Vaping Use   Vaping Use: Never used  Substance and Sexual Activity   Alcohol use: Yes    Alcohol/week: 0.0 standard drinks    Comment: social   Drug use: No   Sexual activity: Not on file  Other Topics Concern   Not on file  Social History Narrative   Not on file   Social Determinants of Health   Financial Resource Strain: Not on file  Food Insecurity: Not on file  Transportation Needs: Not on file  Physical Activity: Not on file  Stress: Not on file  Social Connections: Not on file  Intimate Partner Violence: Not on file     Review of Systems: General: negative for chills, fever, night sweats or weight changes.  Cardiovascular: negative for chest pain, dyspnea on exertion, edema, orthopnea, palpitations, paroxysmal nocturnal dyspnea or shortness of breath Dermatological: negative for rash Respiratory: negative for cough or wheezing Urologic: negative for hematuria Abdominal: negative for nausea, vomiting, diarrhea, bright red blood per rectum, melena, or hematemesis Neurologic: negative for visual changes, syncope, or dizziness All other systems reviewed and are otherwise negative except as noted above.    Blood pressure (!) 160/80, pulse 71, height 5' (1.524 m), weight 166 lb 12.8 oz (75.7 kg), SpO2 97 %.  General appearance: alert and no distress Neck: no adenopathy, no  carotid bruit, no JVD, supple, symmetrical, trachea midline, and thyroid not enlarged, symmetric, no tenderness/mass/nodules Lungs: clear to auscultation bilaterally Heart: regular rate and rhythm, S1, S2 normal, no murmur, click, rub or gallop Extremities: extremities normal, atraumatic, no cyanosis or edema Pulses: 2+ and symmetric Skin: Skin color, texture, turgor normal. No rashes or lesions Neurologic: Grossly normal  EKG sinus rhythm at 71 with left bundle branch block.  I personally reviewed this EKG.  ASSESSMENT AND PLAN:    Hyperlipidemia History of hyperlipidemia on statin therapy with lipid profile performed 04/23/2019 revealing total cholesterol 201, LDL 102 and HDL of 83.  I am going to recheck a lipid and liver profile this morning.  Left bundle branch block Chronic  Chest pain History of atypical chest pain with several negative Myoview stress test in the past most recently 06/24/2014.  I am going to get a coronary calcium score to further evaluate.     Runell Gess MD FACP,FACC,FAHA, [facility] 04/06/2021 10:56 AM

## 2021-04-06 NOTE — Assessment & Plan Note (Signed)
History of atypical chest pain with several negative Myoview stress test in the past most recently 06/24/2014.  I am going to get a coronary calcium score to further evaluate.

## 2021-04-06 NOTE — Addendum Note (Signed)
Addended by: Bernita Buffy on: 04/06/2021 12:02 PM   Modules accepted: Orders

## 2021-06-14 ENCOUNTER — Other Ambulatory Visit: Payer: Self-pay | Admitting: Gastroenterology

## 2021-06-18 ENCOUNTER — Other Ambulatory Visit: Payer: Self-pay

## 2021-08-13 ENCOUNTER — Other Ambulatory Visit: Payer: Self-pay
